# Patient Record
Sex: Female | Born: 1990
Health system: Southern US, Community
[De-identification: ages and names within clinical notes are randomized; demographics above are authoritative.]

## PROBLEM LIST (undated history)

## (undated) DIAGNOSIS — Z789 Other specified health status: Secondary | ICD-10-CM

## (undated) HISTORY — DX: Other specified health status: Z78.9

---
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
## 2011-07-28 LAB — OB RESULTS CONSOLE VARICELLA ZOSTER ANTIBODY, IGG: Varicella: IMMUNE

## 2011-07-28 LAB — SICKLE CELL SCREEN: Sickle Cell Screen: NEGATIVE

## 2011-07-28 LAB — OB RESULTS CONSOLE RUBELLA ANTIBODY, IGM: Rubella: IMMUNE

## 2011-08-18 ENCOUNTER — Encounter: Payer: Self-pay | Admitting: Obstetrics and Gynecology

## 2011-08-29 ENCOUNTER — Ambulatory Visit: Payer: Self-pay | Admitting: Obstetrics and Gynecology

## 2011-09-26 ENCOUNTER — Ambulatory Visit: Payer: Self-pay | Admitting: Obstetrics and Gynecology

## 2011-09-29 ENCOUNTER — Encounter: Payer: Self-pay | Admitting: Maternal & Fetal Medicine

## 2011-10-26 ENCOUNTER — Ambulatory Visit: Payer: Self-pay | Admitting: Obstetrics and Gynecology

## 2011-12-23 ENCOUNTER — Inpatient Hospital Stay: Payer: Self-pay

## 2011-12-23 LAB — CBC WITH DIFFERENTIAL/PLATELET
Basophil #: 0 10*3/uL (ref 0.0–0.1)
Eosinophil %: 0.2 %
HCT: 34.7 % — ABNORMAL LOW (ref 35.0–47.0)
HGB: 11.8 g/dL — ABNORMAL LOW (ref 12.0–16.0)
Lymphocyte #: 1.6 10*3/uL (ref 1.0–3.6)
MCH: 29.3 pg (ref 26.0–34.0)
MCV: 86 fL (ref 80–100)
Monocyte #: 0.4 x10 3/mm (ref 0.2–0.9)
Neutrophil %: 84.9 %
Platelet: 162 10*3/uL (ref 150–440)
RDW: 14.5 % (ref 11.5–14.5)

## 2011-12-23 LAB — HEMATOCRIT: HCT: 32.5 % — ABNORMAL LOW (ref 35.0–47.0)

## 2012-05-30 IMAGING — US US OB DETAIL+14 WK - NRPT [facility]
1 series · 14 of 28 positions shown · non-contrast
Comparison: none

[Series 1: us ob detail+14 wk - nrpt [facility] · 14 of 76 slices shown]
[im 3/76]
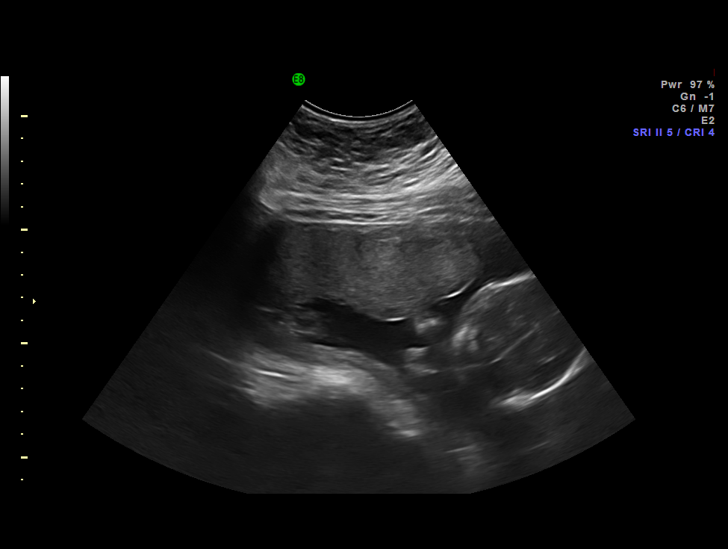
[im 9/76]
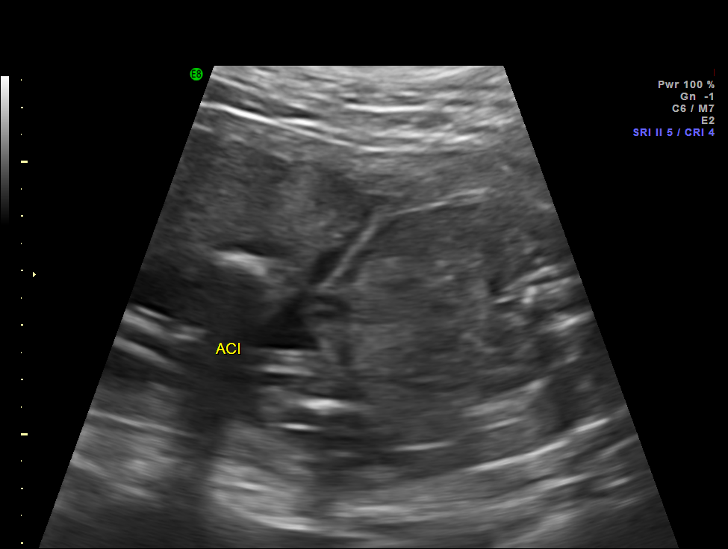
[im 14/76]
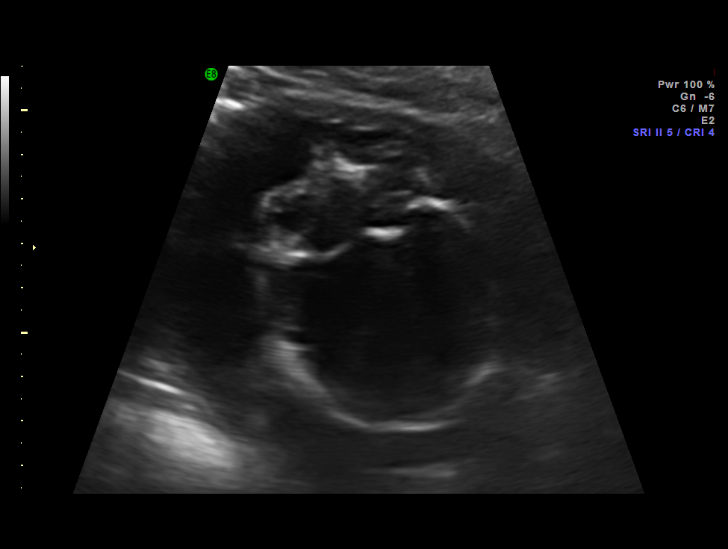
[im 20/76]
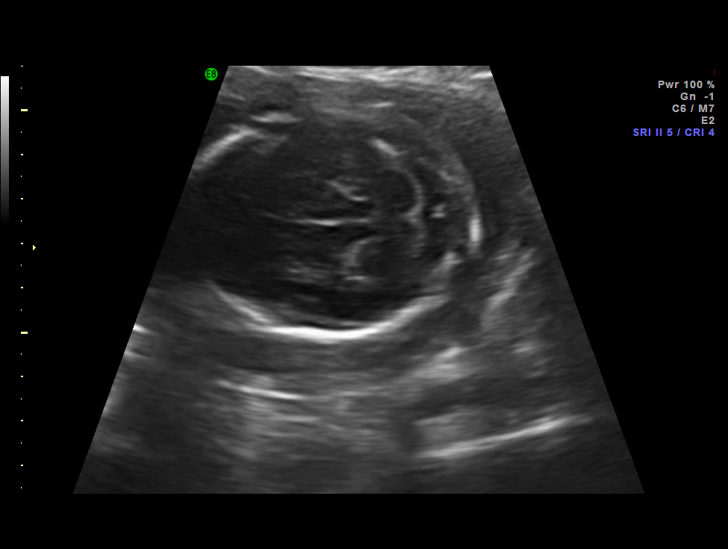
[im 26/76]
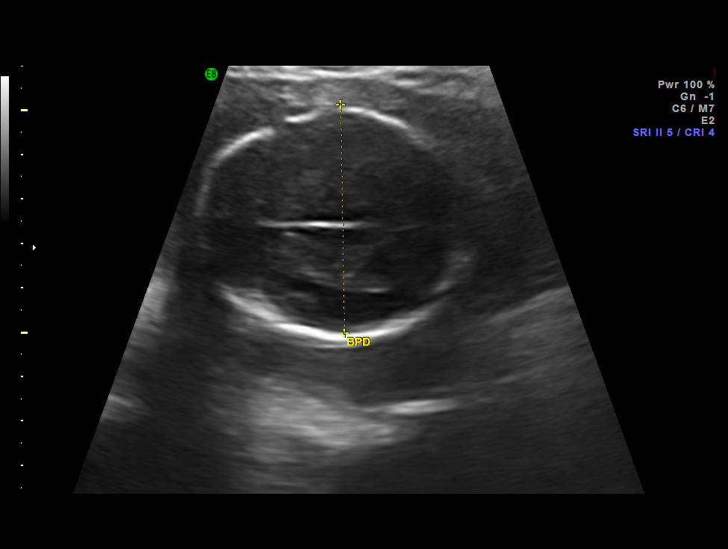
[im 31/76]
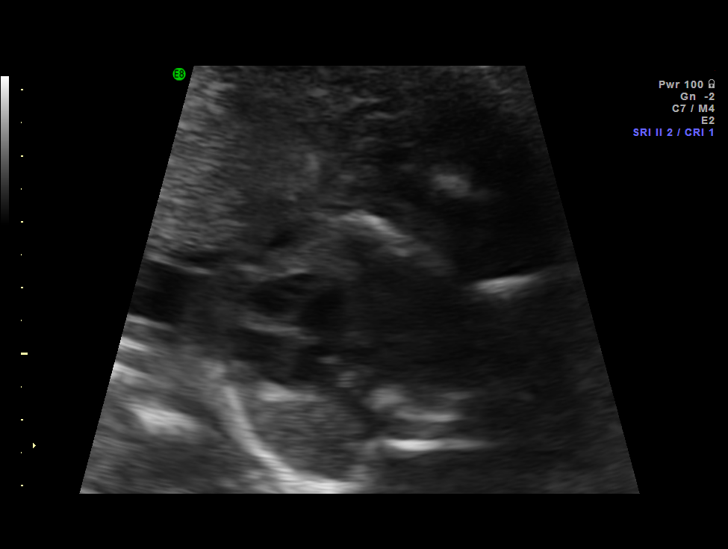
[im 37/76]
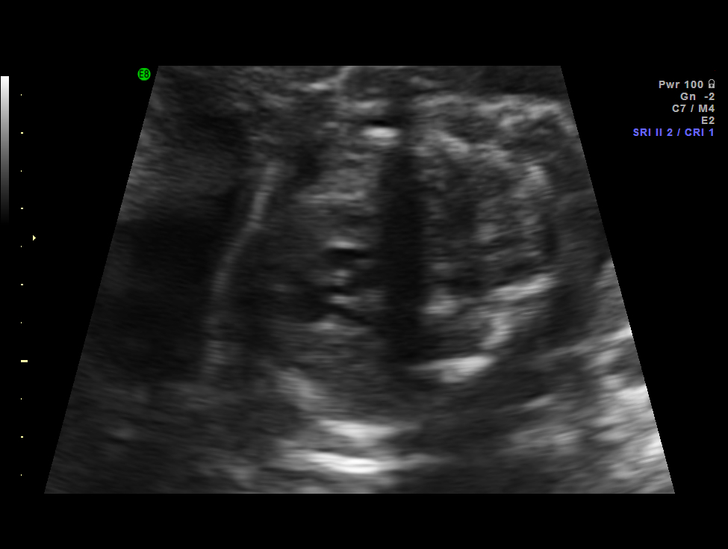
[im 42/76]
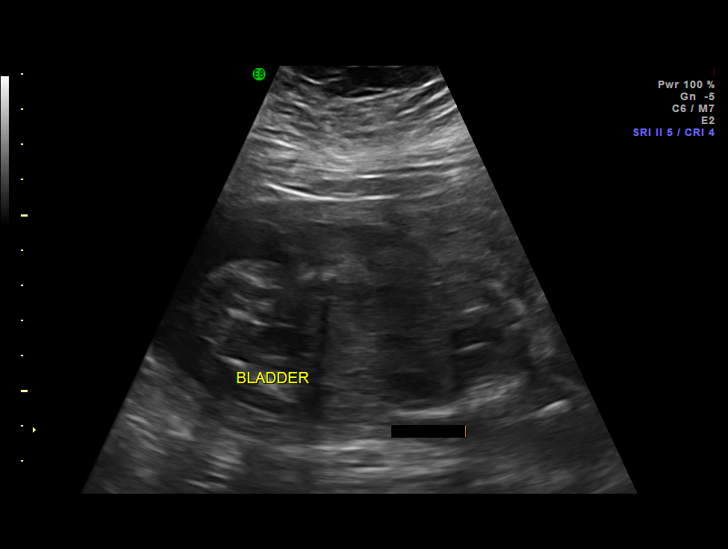
[im 48/76]
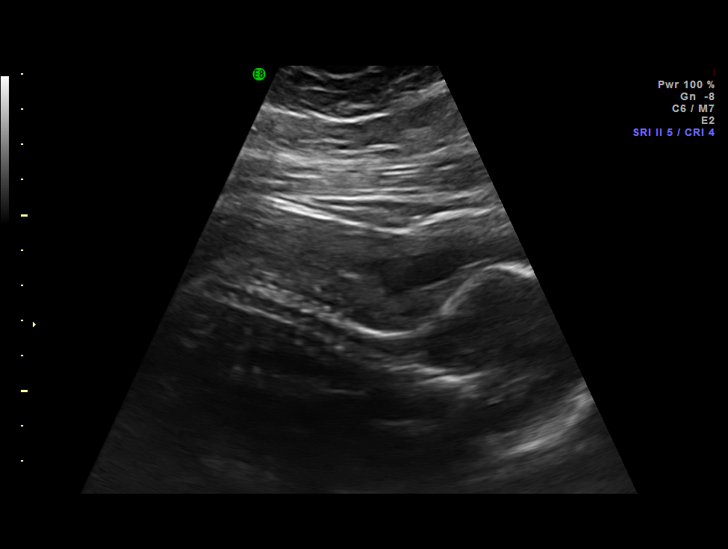
[im 53/76]
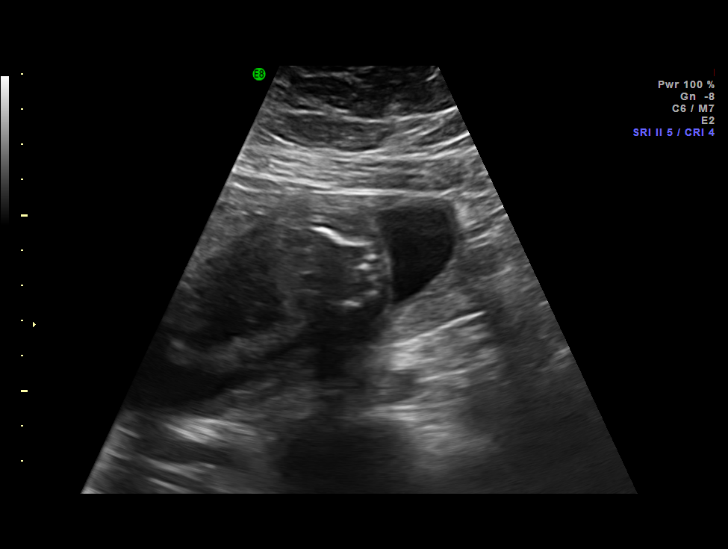
[im 59/76]
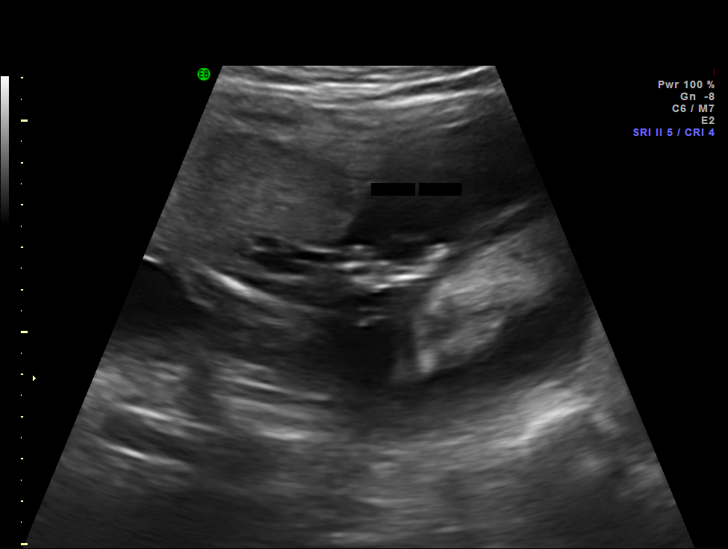
[im 64/76]
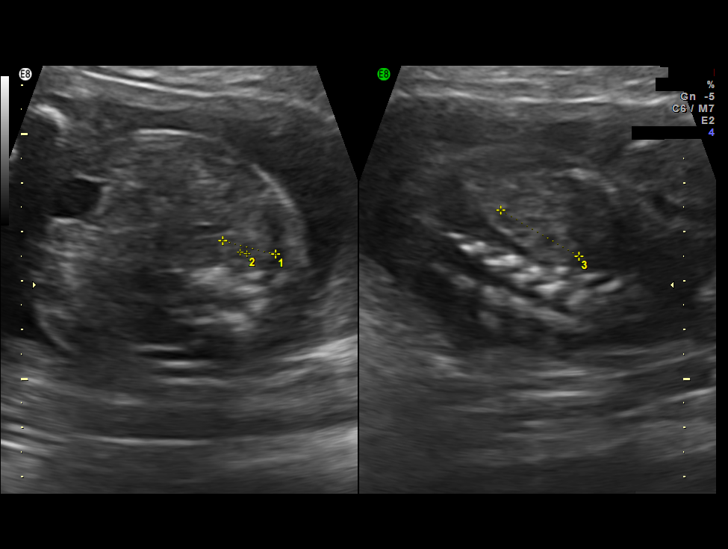
[im 70/76]
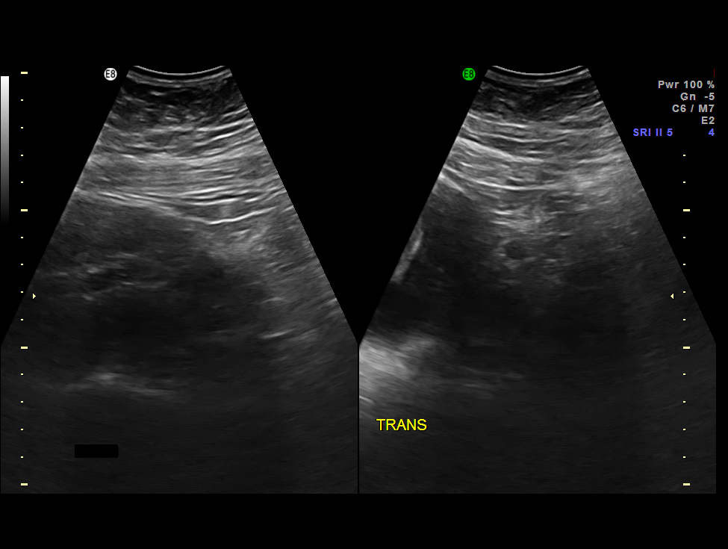
[im 76/76]
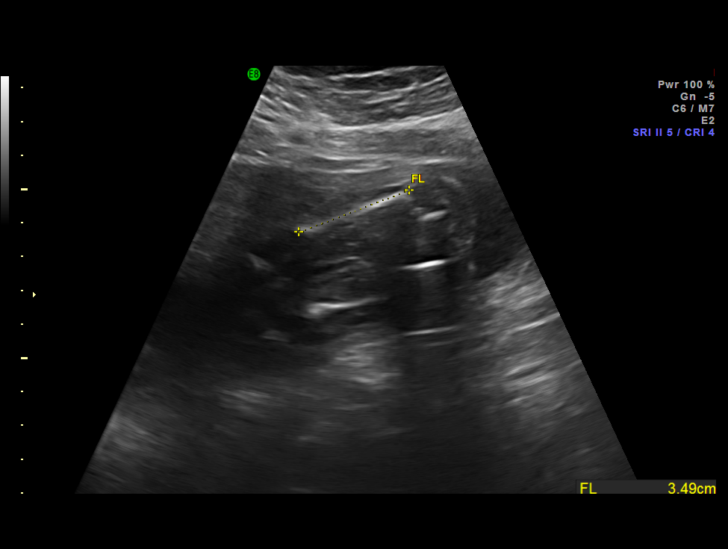

[14 of 28 positions shown; findings below may reference images not displayed]

IMAGES IMPORTED FROM THE [employer] WORKFLOW SYSTEM
NO DICTATION FOR STUDY

## 2012-07-11 IMAGING — US US OB FOLLOW-UP - [facility]PT [facility]
1 series · 14 of 28 positions shown · non-contrast
Comparison: none

[Series 1: us ob follow-up - nrpt [facility] · 14 of 67 slices shown]
[im 3/67]
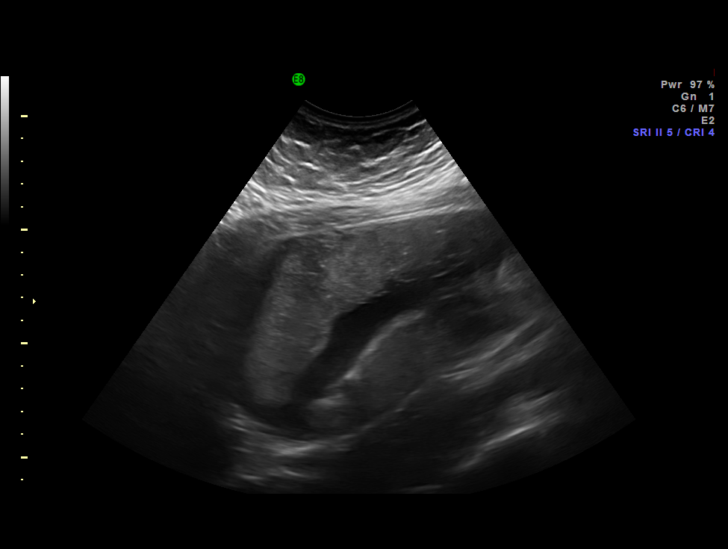
[im 8/67]
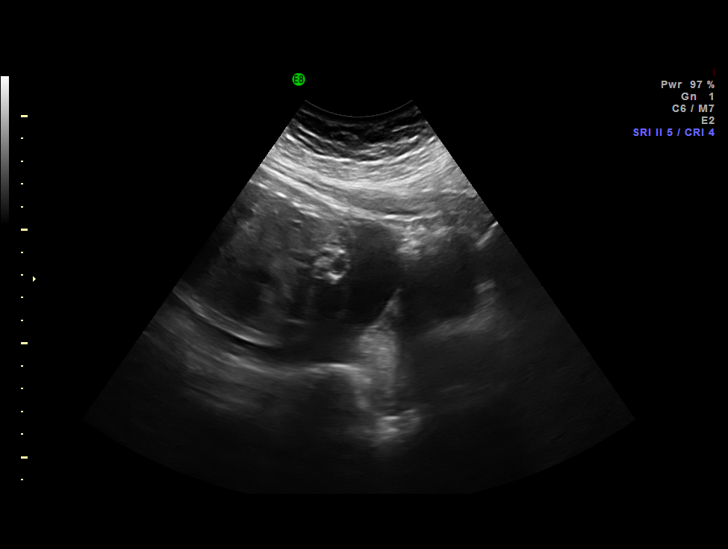
[im 13/67]
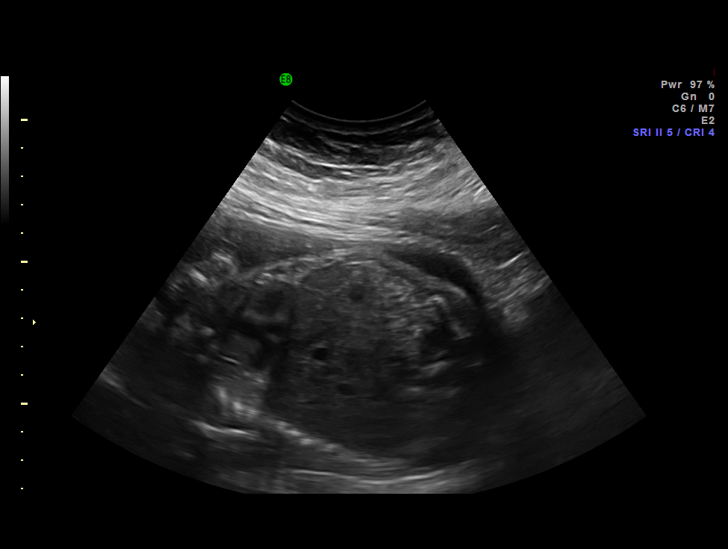
[im 18/67]
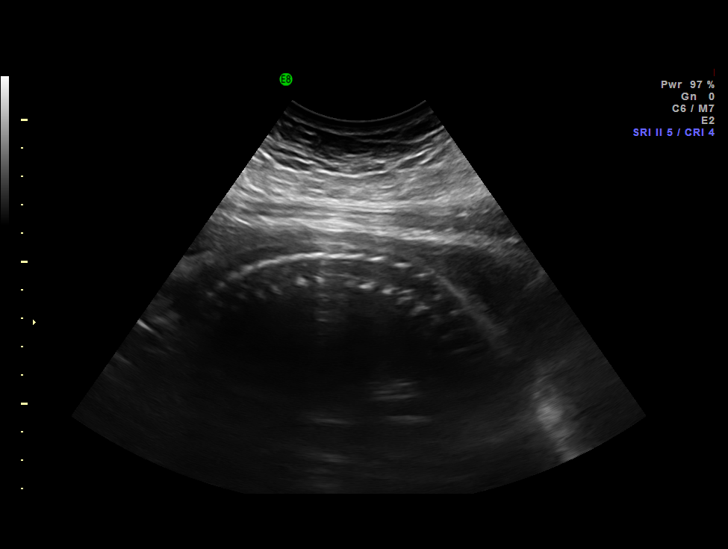
[im 23/67]
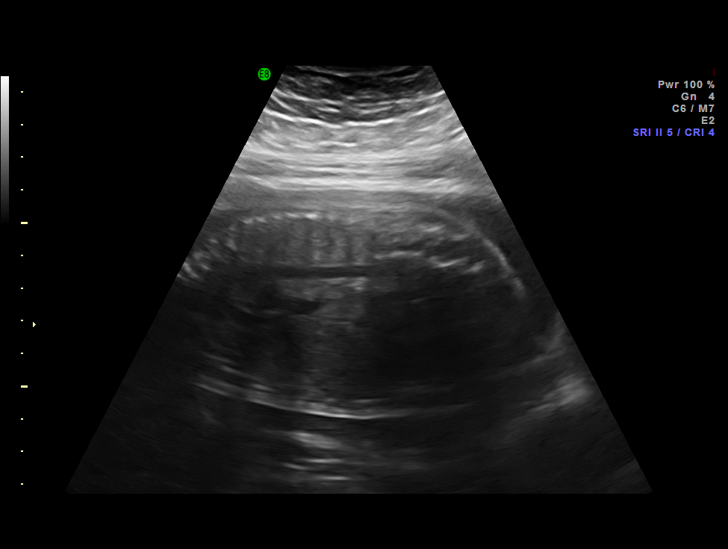
[im 27/67]
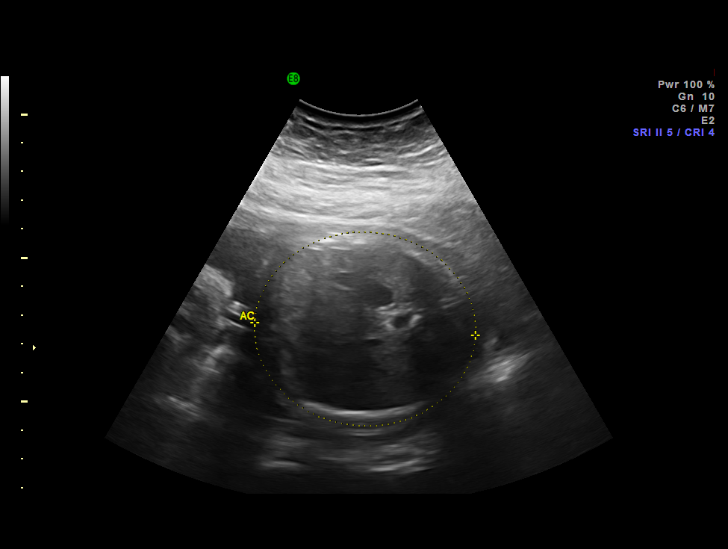
[im 32/67]
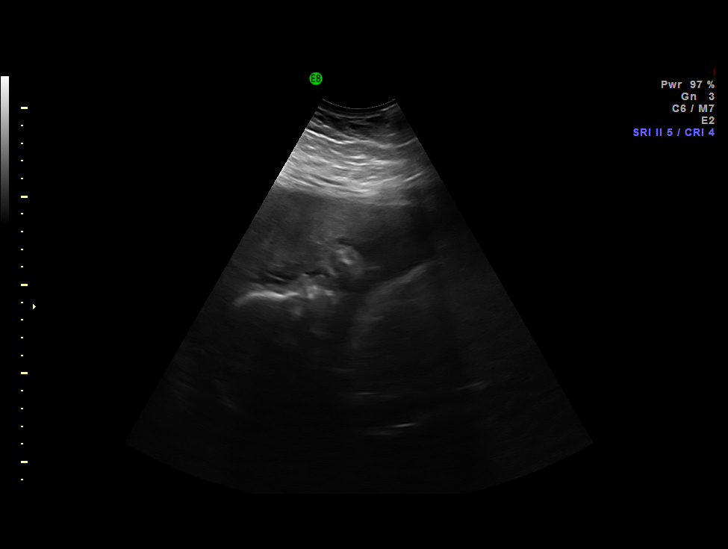
[im 37/67]
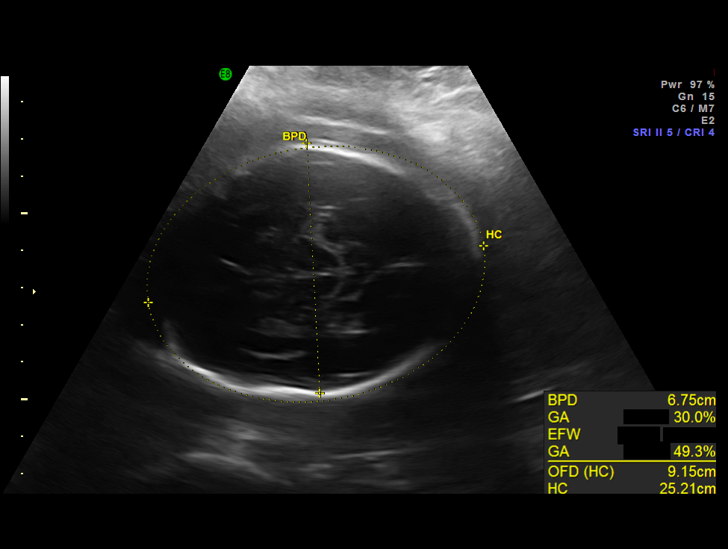
[im 42/67]
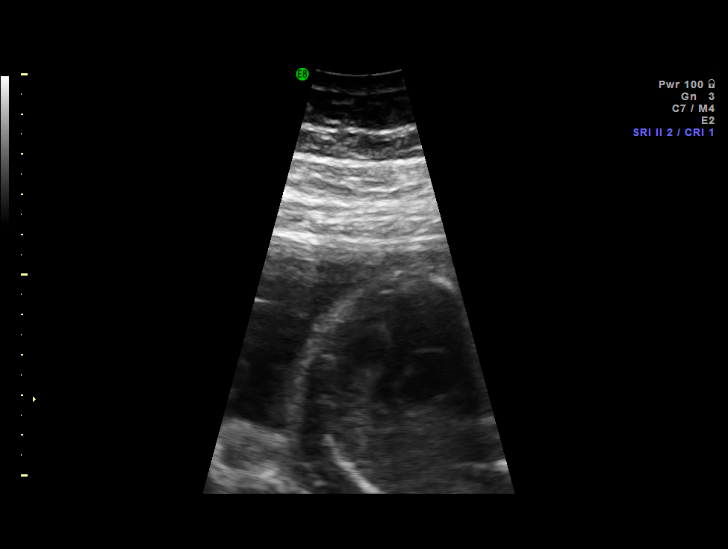
[im 47/67]
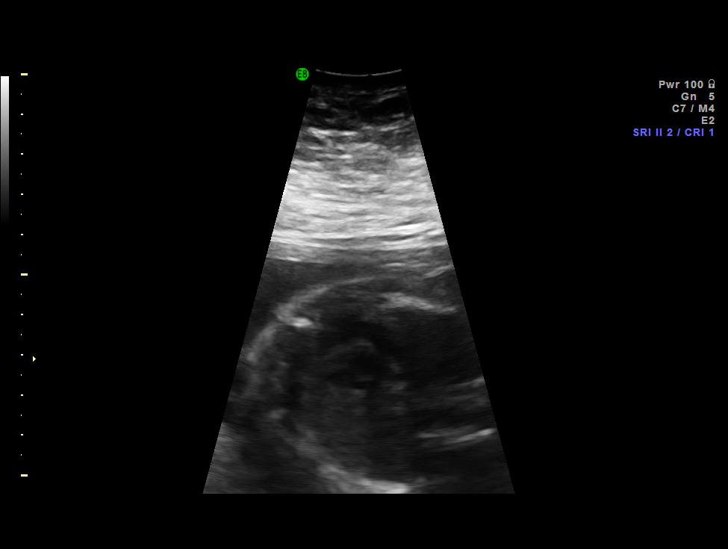
[im 52/67]
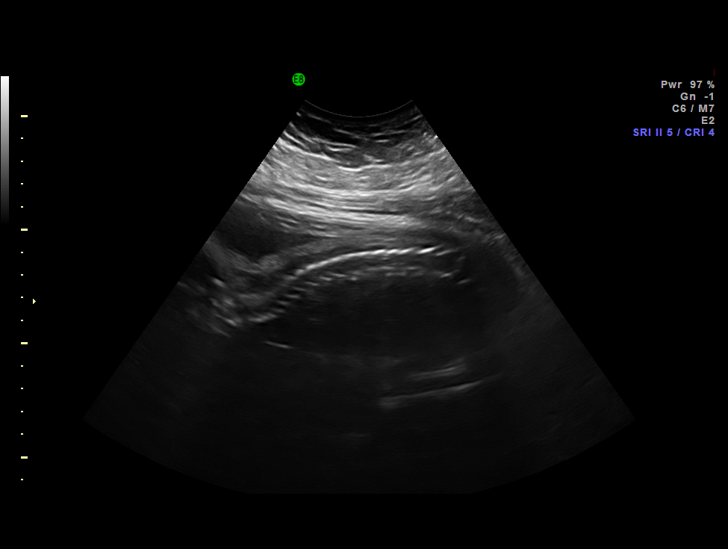
[im 57/67]
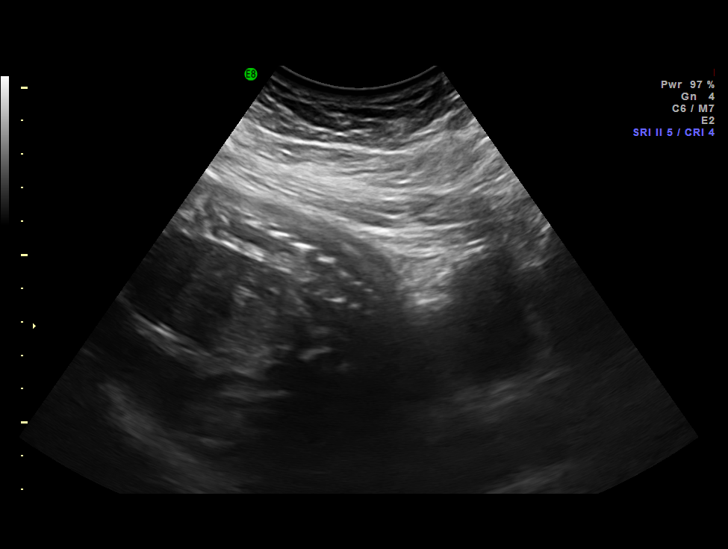
[im 62/67]
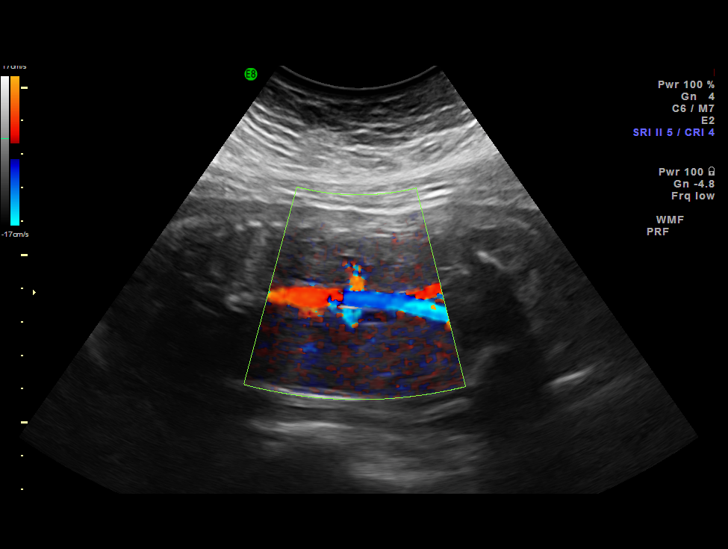
[im 67/67]
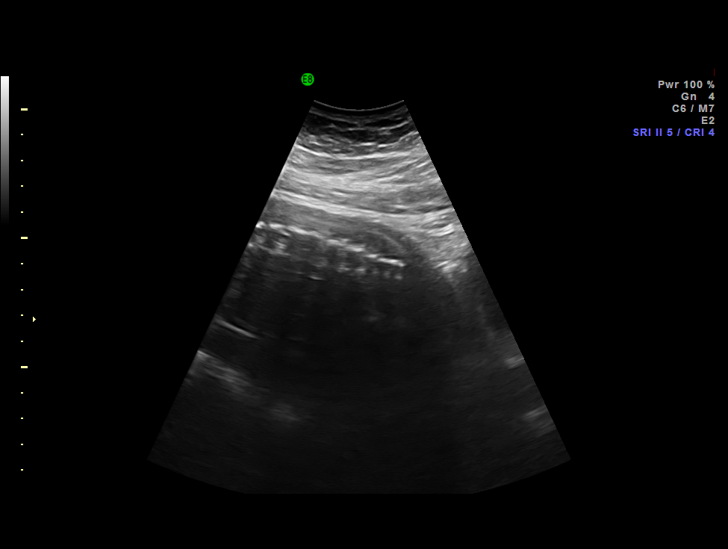

[14 of 28 positions shown; findings below may reference images not displayed]

IMAGES IMPORTED FROM THE [employer] WORKFLOW SYSTEM
NO DICTATION FOR STUDY

## 2014-10-19 NOTE — Consult Note (Signed)
Referral Information:   Reason for Referral 24 yo G1 p0 referred for elevated BMI -42    Referring Physician [facility]    Prenatal Hx Unsure LMP , some weight loss otherwise  uncomplicated thus far Jehovah's witness but will accept blood products in an emergency    Past Obstetrical Hx primigravida   Home Medications: Medication Instructions Status  Prenatal Multivitamins oral tablet 1 tab(s) orally once a day Active   Allergies:   No Known Allergies:   Vital Signs/Notes:  Nursing Vital Signs: **Vital Signs.:   21-Feb-13 11:26   Vital Signs Type Routine   Temperature Temperature (F) 98.2   Celsius 36.7   Temperature Source oral   Pulse Pulse 75   Pulse source per Dinamap   Respirations Respirations 14   Systolic BP Systolic BP 114   Diastolic BP (mmHg) Diastolic BP (mmHg) 58   Mean BP 76   BP Source Dinamap   Perinatal Consult:   LMP 15-Apr-2011    Past Medical History cont'd None - no asthma    PSurg Hx none    FHx obesity , father deceased in MVA    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 Her mom is obese also. 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 No co-morbidities reported , no DM,HTN or sleep apnea.    Recommendations 1 refer to lifestyle center- advised to limit weight gain to 15 pounds  2 I reviewed increased risk for HTN complicaitons including pregnancy. Given 20 weeks and normotensive , I did not  recommend baby aspirin.  3 No h/o snoring or excessive tiredness - I did not recommend a sleep study at this time 4 Follow up ultrasounds for growth in third trimester - I ordered one in 6 weeks - consider q 4 weeks if unable to follow fundal heights 5pt reports she had early glucola- repeat at 28 weeks if normal 6 Check TSH , baseline protein /creatinine in urine and consider renal chemistries given increased risk of preeclampsia  7 Sequential compression devices if cesarean birth.Use 3 g cefazolin does for prophylaxis if cesarean   Plan:   Genetic Counseling no    Prenatal Diagnosis Options Level II [place]    Ultrasound at what gestational ages Monthly > 28 weeks, if unable to follow fundal height    Antepartum Testing weekly at 36 weeks    Delivery Mode Vaginal    Additional Testing Thyroid panel, renal function baseline    Delivery at what gestational age by 5 weeks     Total Time Spent with Patient 15 minutes    >50% of visit spent in couseling/coordination of care yes    Office Use Only [address]  Level 1 ( ) NEW office consult prob focused   Coding Description: MATERNAL CONDITIONS/HISTORY INDICATION(S).   Obesity - BMI greater than equal to 30.  Electronic Signatures: Rondall Allegra (MD)  (Signed  21-Feb-13 13:33)  Authored: Referral, Home Medications, Allergies, Vital Signs/Notes, Consult, Exam, Impression, Plan, Billing, Coding Description   Last Updated: 21-Feb-13 13:33 by Rondall AllegraLivingston, Owais Pruett [place]am (MD)

## 2014-11-04 NOTE — H&P (Signed)
L&D Evaluation:  History:   HPI 24 year old Hispanic female with EDC=26 December 2011 based on a 21 week ultrasound was sent from [facility] with irregular contractions (q4-7 min apart) and with cervix dilated to 4 cm. Contractions started yesterday but worsened at 5 AM this morning. +mucoid discharge this AM, but no bleeding. She advanced in dilation to 6 cm in 1-2 hours. Prenatal care at [facility] remarkable for obesity with BMI>40. Labs: A POS, RI, VI, GBS negative    Presents with contractions, advanced cervical dilation in [facility]    Patient's Medical History Obesity-BMI>40    Medications Pre Natal Vitamins    Allergies NKDA    Social History none    Family History Non-Contributory   ROS:   ROS see HPI   Exam:   Vital Signs mildly elevated143/83, 150/80    Urine Protein negative dipstick    General no apparent distress, breaths/concentrates thru contractions    Mental Status clear    Chest clear    Heart normal sinus rhythm, no murmur/gallop/rubs    Abdomen gravid, tender with contractions    Estimated Fetal Weight Average for gestational age, 566 1/2 to 7 #    Fetal Position cephalic on US    Edema no edema    Reflexes 1+    Pelvic no external lesions, 6/80%/-1    Mebranes Intact    FHT normal rate with no decels, nicely reactive strip    FHT Description moderate variability    Ucx unable to pick up with external monitors    Skin dry    Lymph no lymphadenopathy   Impression:   Impression IUP at 39 4/7 weeks in labor   Plan:   Plan EFM/NST, monitor contractions and for cervical change, monitor BP, Plan AROM after admission completed    Comments 1705 PM: AROm scant clear-pink tinged fluid. IUPC inserted.   Electronic Signatures: Trinna BalloonGutierrez, Aleynah Rocchio L (CNM)  (Signed 28-Jun-13 17:06)  Authored: L&D Evaluation   Last Updated: 28-Jun-13 17:06 by Trinna BalloonGutierrez, Adoria Kawamoto L (CNM)

## 2015-10-15 ENCOUNTER — Encounter: Payer: Self-pay | Admitting: Podiatry

## 2015-10-15 ENCOUNTER — Ambulatory Visit (INDEPENDENT_AMBULATORY_CARE_PROVIDER_SITE_OTHER): Payer: BLUE CROSS/BLUE SHIELD

## 2015-10-15 ENCOUNTER — Ambulatory Visit (INDEPENDENT_AMBULATORY_CARE_PROVIDER_SITE_OTHER): Payer: BLUE CROSS/BLUE SHIELD | Admitting: Podiatry

## 2015-10-15 VITALS — BP 127/72 | HR 72 | Resp 18

## 2015-10-15 DIAGNOSIS — M722 Plantar fascial fibromatosis: Secondary | ICD-10-CM | POA: Insufficient documentation

## 2015-10-15 DIAGNOSIS — M7661 Achilles tendinitis, right leg: Secondary | ICD-10-CM | POA: Insufficient documentation

## 2015-10-15 DIAGNOSIS — R52 Pain, unspecified: Secondary | ICD-10-CM | POA: Diagnosis not present

## 2015-10-15 MED ORDER — MELOXICAM 15 MG PO TABS: 15.0000 mg | ORAL_TABLET | Freq: Every day | ORAL | Status: DC

## 2015-10-15 NOTE — Progress Notes (Signed)
   Subjective:    Patient ID: Tiffany Carpenter, female    DOB: 01/17/1991, 25 y.o.   MRN: [id number]  HPI  25 year old female presents the office today for concerns of left heel pain which is been ongoing for approximately 5 months. Senna time she's had some pain in the back of the right heel She states that she has pain in the morning and she first gets up works as a now presenting time and stands back up. Denies any redness or swelling. No tingling or numbness. The pain does not wake her up at night. She has no previous treatment. No other complaints.   Review of Systems  All other systems reviewed and are negative.      Objective:   Physical Exam General: AAO x3, NAD  Dermatological: Skin is warm, dry and supple bilateral. Nails x 10 are well manicured; remaining integument appears unremarkable at this time. There are no open sores, no preulcerative lesions, no rash or signs of infection present.  Vascular: Dorsalis Pedis artery and Posterior Tibial artery pedal pulses are 2/4 bilateral with immedate capillary fill time. Pedal hair growth present. No varicosities and no lower extremity edema present bilateral. There is no pain with calf compression, swelling, warmth, erythema.   Neruologic: Grossly intact via light touch bilateral. Vibratory intact via tuning fork bilateral. Protective threshold with Semmes Wienstein monofilament intact to all pedal sites bilateral. Patellar and Achilles deep tendon reflexes 2+ bilateral. No Babinski or clonus noted bilateral.   Musculoskeletal: On the right foot there is tenderness to palpation on the insertion the Achilles tendon. There is no pain with medial to lateral compression of the calcaneus. No defect noted the Achilles tendon tendon appears to be intact. Tenderness to palpation along the plantar medial tubercle of the calcaneus at the insertion of plantar fascia on the left foot. There is no pain along the course of the plantar fascia within the  arch of the foot. Plantar fascia appears to be intact. There is no pain with lateral compression of the calcaneus or pain with vibratory sensation. There is no pain along the course or insertion of the achilles tendon. No other areas of tenderness to bilateral lower extremitie  Gait: Unassisted, Nonantalgic.       Assessment & Plan:   25 year old female left heel pain, likely plantar fasciitis right Achilles tendinitis  -Treatment options discussed including all alternatives, risks, and complications -Etiology of symptoms were discussed -X-rays were obtained and reviewed with the patient. No evidence of acute fracture or stress fracture.  -Discussed steroid injection but she wishes to hold off  -Prescribed mobic. Discussed side effects of the medication and directed to stop if any are to occur and call the office.  -Plantar fascial brace  -Night splint  -Shoe gear changes  -Follow-up in 3 weeks or sooner if any problems arise. In the meantime, encouraged to call the office with any questions, concerns, change in symptoms.   Ovid CurdMatthew Wagoner, DPM

## 2015-10-15 NOTE — Patient Instructions (Signed)
Achilles Tendinitis With Rehab Achilles tendinitis is a disorder of the Achilles tendon. The Achilles tendon connects the large calf muscles (Gastrocnemius and Soleus) to the heel bone (calcaneus). This tendon is sometimes called the heel cord. It is important for pushing-off and standing on your toes and is important for walking, running, or jumping. Tendinitis is often caused by overuse and repetitive microtrauma. SYMPTOMS  Pain, tenderness, swelling, warmth, and redness may occur over the Achilles tendon even at rest.  Pain with pushing off, or flexing or extending the ankle.  Pain that is worsened after or during activity. CAUSES   Overuse sometimes seen with rapid increase in exercise programs or in sports requiring running and jumping.  Poor physical conditioning (strength and flexibility or endurance).  Running sports, especially training running down hills.  Inadequate warm-up before practice or play or failure to stretch before participation.  Injury to the tendon. PREVENTION   Warm up and stretch before practice or competition.  Allow time for adequate rest and recovery between practices and competition.  Keep up conditioning.  Keep up ankle and leg flexibility.  Improve or keep muscle strength and endurance.  Improve cardiovascular fitness.  Use proper technique.  Use proper equipment (shoes, skates).  To help prevent recurrence, taping, protective strapping, or an adhesive bandage may be recommended for several weeks after healing is complete. PROGNOSIS   Recovery may take weeks to several months to heal.  Longer recovery is expected if symptoms have been prolonged.  Recovery is usually quicker if the inflammation is due to a direct blow as compared with overuse or sudden strain. RELATED COMPLICATIONS   Healing time will be prolonged if the condition is not correctly treated. The injury must be given plenty of time to heal.  Symptoms can reoccur if  activity is resumed too soon.  Untreated, tendinitis may increase the risk of tendon rupture requiring additional time for recovery and possibly surgery. TREATMENT   The first treatment consists of rest anti-inflammatory medication, and ice to relieve the pain.  Stretching and strengthening exercises after resolution of pain will likely help reduce the risk of recurrence. Referral to a physical therapist or athletic trainer for further evaluation and treatment may be helpful.  A walking boot or cast may be recommended to rest the Achilles tendon. This can help break the cycle of inflammation and microtrauma.  Arch supports (orthotics) may be prescribed or recommended by your caregiver as an adjunct to therapy and rest.  Surgery to remove the inflamed tendon lining or degenerated tendon tissue is rarely necessary and has shown less than predictable results. MEDICATION   Nonsteroidal anti-inflammatory medications, such as aspirin and ibuprofen, may be used for pain and inflammation relief. Do not take within 7 days before surgery. Take these as directed by your caregiver. Contact your caregiver immediately if any bleeding, stomach upset, or signs of allergic reaction occur. Other minor pain relievers, such as acetaminophen, may also be used.  Pain relievers may be prescribed as necessary by your caregiver. Do not take prescription pain medication for longer than 4 to 7 days. Use only as directed and only as much as you need.  Cortisone injections are rarely indicated. Cortisone injections may weaken tendons and predispose to rupture. It is better to give the condition more time to heal than to use them. HEAT AND COLD  Cold is used to relieve pain and reduce inflammation for acute and chronic Achilles tendinitis. Cold should be applied for 10 to 15 minutes every   2 to 3 hours for inflammation and pain and immediately after any activity that aggravates your symptoms. Use ice packs or an ice  massage.  Heat may be used before performing stretching and strengthening activities prescribed by your caregiver. Use a heat pack or a warm soak. SEEK MEDICAL CARE IF:  Symptoms get worse or do not improve in 2 weeks despite treatment.  New, unexplained symptoms develop. Drugs used in treatment may produce side effects. EXERCISES RANGE OF MOTION (ROM) AND STRETCHING EXERCISES - Achilles Tendinitis  These exercises may help you when beginning to rehabilitate your injury. Your symptoms may resolve with or without further involvement from your physician, physical therapist or athletic trainer. While completing these exercises, remember:   Restoring tissue flexibility helps normal motion to return to the joints. This allows healthier, less painful movement and activity.  An effective stretch should be held for at least 30 seconds.  A stretch should never be painful. You should only feel a gentle lengthening or release in the stretched tissue. STRETCH - Gastroc, Standing   Place hands on wall.  Extend right / left leg, keeping the front knee somewhat bent.  Slightly point your toes inward on your back foot.  Keeping your right / left heel on the floor and your knee straight, shift your weight toward the wall, not allowing your back to arch.  You should feel a gentle stretch in the right / left calf. Hold this position for __________ seconds. Repeat __________ times. Complete this stretch __________ times per day. STRETCH - Soleus, Standing   Place hands on wall.  Extend right / left leg, keeping the other knee somewhat bent.  Slightly point your toes inward on your back foot.  Keep your right / left heel on the floor, bend your back knee, and slightly shift your weight over the back leg so that you feel a gentle stretch deep in your back calf.  Hold this position for __________ seconds. Repeat __________ times. Complete this stretch __________ times per day. STRETCH -  Gastrocsoleus, Standing  Note: This exercise can place a lot of stress on your foot and ankle. Please complete this exercise only if specifically instructed by your caregiver.   Place the ball of your right / left foot on a step, keeping your other foot firmly on the same step.  Hold on to the wall or a rail for balance.  Slowly lift your other foot, allowing your body weight to press your heel down over the edge of the step.  You should feel a stretch in your right / left calf.  Hold this position for __________ seconds.  Repeat this exercise with a slight bend in your knee. Repeat __________ times. Complete this stretch __________ times per day.  STRENGTHENING EXERCISES - Achilles Tendinitis These exercises may help you when beginning to rehabilitate your injury. They may resolve your symptoms with or without further involvement from your physician, physical therapist or athletic trainer. While completing these exercises, remember:   Muscles can gain both the endurance and the strength needed for everyday activities through controlled exercises.  Complete these exercises as instructed by your physician, physical therapist or athletic trainer. Progress the resistance and repetitions only as guided.  You may experience muscle soreness or fatigue, but the pain or discomfort you are trying to eliminate should never worsen during these exercises. If this pain does worsen, stop and make certain you are following the directions exactly. If the pain is still present after adjustments,   discontinue the exercise until you can discuss the trouble with your clinician. STRENGTH - Plantar-flexors   Sit with your right / left leg extended. Holding onto both ends of a rubber exercise band/tubing, loop it around the ball of your foot. Keep a slight tension in the band.  Slowly push your toes away from you, pointing them downward.  Hold this position for __________ seconds. Return slowly, controlling the  tension in the band/tubing. Repeat __________ times. Complete this exercise __________ times per day.  STRENGTH - Plantar-flexors   Stand with your feet shoulder width apart. Steady yourself with a wall or table using as little support as needed.  Keeping your weight evenly spread over the width of your feet, rise up on your toes.*  Hold this position for __________ seconds. Repeat __________ times. Complete this exercise __________ times per day.  *If this is too easy, shift your weight toward your right / left leg until you feel challenged. Ultimately, you may be asked to do this exercise with your right / left foot only. STRENGTH - Plantar-flexors, Eccentric  Note: This exercise can place a lot of stress on your foot and ankle. Please complete this exercise only if specifically instructed by your caregiver.   Place the balls of your feet on a step. With your hands, use only enough support from a wall or rail to keep your balance.  Keep your knees straight and rise up on your toes.  Slowly shift your weight entirely to your right / left toes and pick up your opposite foot. Gently and with controlled movement, lower your weight through your right / left foot so that your heel drops below the level of the step. You will feel a slight stretch in the back of your calf at the end position.  Use the healthy leg to help rise up onto the balls of both feet, then lower weight only on the right / left leg again. Build up to 15 repetitions. Then progress to 3 consecutive sets of 15 repetitions.*  After completing the above exercise, complete the same exercise with a slight knee bend (about 30 degrees). Again, build up to 15 repetitions. Then progress to 3 consecutive sets of 15 repetitions.* Perform this exercise __________ times per day.  *When you easily complete 3 sets of 15, your physician, physical therapist or athletic trainer may advise you to add resistance by wearing a backpack filled with  additional weight. STRENGTH - Plantar Flexors, Seated   Sit on a chair that allows your feet to rest flat on the ground. If necessary, sit at the edge of the chair.  Keeping your toes firmly on the ground, lift your right / left heel as far as you can without increasing any discomfort in your ankle. Repeat __________ times. Complete this exercise __________ times a day. *If instructed by your physician, physical therapist or athletic trainer, you may add ____________________ of resistance by placing a weighted object on your right / left knee.   This information is not intended to replace advice given to you by your health care provider. Make sure you discuss any questions you have with your health care provider.   Document Released: 01/12/2005 Document Revised: 07/04/2014 Document Reviewed: 09/25/2008 Else[employer] Interactive Patient Education 2016 [employer]   Plantar Fasciitis With Rehab The plantar fascia is a fibrous, ligament-like, soft-tissue structure that spans the bottom of the foot. Plantar fasciitis, also called heel spur syndrome, is a condition that causes pain in the foot due   to inflammation of the tissue. SYMPTOMS   Pain and tenderness on the underneath side of the foot.  Pain that worsens with standing or walking. CAUSES  Plantar fasciitis is caused by irritation and injury to the plantar fascia on the underneath side of the foot. Common mechanisms of injury include:  Direct trauma to bottom of the foot.  Damage to a small nerve that runs under the foot where the main fascia attaches to the heel bone.  Stress placed on the plantar fascia due to bone spurs. RISK INCREASES WITH:   Activities that place stress on the plantar fascia (running, jumping, pivoting, or cutting).  Poor strength and flexibility.  Improperly fitted shoes.  Tight calf muscles.  Flat feet.  Failure to warm-up properly before activity.  Obesity. PREVENTION  Warm up and stretch  properly before activity.  Allow for adequate recovery between workouts.  Maintain physical fitness:  Strength, flexibility, and endurance.  Cardiovascular fitness.  Maintain a health body weight.  Avoid stress on the plantar fascia.  Wear properly fitted shoes, including arch supports for individuals who have flat feet. PROGNOSIS  If treated properly, then the symptoms of plantar fasciitis usually resolve without surgery. However, occasionally surgery is necessary. RELATED COMPLICATIONS   Recurrent symptoms that may result in a chronic condition.  Problems of the lower back that are caused by compensating for the injury, such as limping.  Pain or weakness of the foot during push-off following surgery.  Chronic inflammation, scarring, and partial or complete fascia tear, occurring more often from repeated injections. TREATMENT  Treatment initially involves the use of ice and medication to help reduce pain and inflammation. The use of strengthening and stretching exercises may help reduce pain with activity, especially stretches of the Achilles tendon. These exercises may be performed at home or with a therapist. Your caregiver may recommend that you use heel cups of arch supports to help reduce stress on the plantar fascia. Occasionally, corticosteroid injections are given to reduce inflammation. If symptoms persist for greater than 6 months despite non-surgical (conservative), then surgery may be recommended.  MEDICATION   If pain medication is necessary, then nonsteroidal anti-inflammatory medications, such as aspirin and ibuprofen, or other minor pain relievers, such as acetaminophen, are often recommended.  Do not take pain medication within 7 days before surgery.  Prescription pain relievers may be given if deemed necessary by your caregiver. Use only as directed and only as much as you need.  Corticosteroid injections may be given by your caregiver. These injections should  be reserved for the most serious cases, because they may only be given a certain number of times. HEAT AND COLD  Cold treatment (icing) relieves pain and reduces inflammation. Cold treatment should be applied for 10 to 15 minutes every 2 to 3 hours for inflammation and pain and immediately after any activity that aggravates your symptoms. Use ice packs or massage the area with a piece of ice (ice massage).  Heat treatment may be used prior to performing the stretching and strengthening activities prescribed by your caregiver, physical therapist, or athletic trainer. Use a heat pack or soak the injury in warm water. SEEK IMMEDIATE MEDICAL CARE IF:  Treatment seems to offer no benefit, or the condition worsens.  Any medications produce adverse side effects. EXERCISES RANGE OF MOTION (ROM) AND STRETCHING EXERCISES - Plantar Fasciitis (Heel Spur Syndrome) These exercises may help you when beginning to rehabilitate your injury. Your symptoms may resolve with or without further involvement from your   physician, physical therapist or athletic trainer. While completing these exercises, remember:   Restoring tissue flexibility helps normal motion to return to the joints. This allows healthier, less painful movement and activity.  An effective stretch should be held for at least 30 seconds.  A stretch should never be painful. You should only feel a gentle lengthening or release in the stretched tissue. RANGE OF MOTION - Toe Extension, Flexion  Sit with your right / left leg crossed over your opposite knee.  Grasp your toes and gently pull them back toward the top of your foot. You should feel a stretch on the bottom of your toes and/or foot.  Hold this stretch for __________ seconds.  Now, gently pull your toes toward the bottom of your foot. You should feel a stretch on the top of your toes and or foot.  Hold this stretch for __________ seconds. Repeat __________ times. Complete this stretch  __________ times per day.  RANGE OF MOTION - Ankle Dorsiflexion, Active Assisted  Remove shoes and sit on a chair that is preferably not on a carpeted surface.  Place right / left foot under knee. Extend your opposite leg for support.  Keeping your heel down, slide your right / left foot back toward the chair until you feel a stretch at your ankle or calf. If you do not feel a stretch, slide your bottom forward to the edge of the chair, while still keeping your heel down.  Hold this stretch for __________ seconds. Repeat __________ times. Complete this stretch __________ times per day.  STRETCH - Gastroc, Standing  Place hands on wall.  Extend right / left leg, keeping the front knee somewhat bent.  Slightly point your toes inward on your back foot.  Keeping your right / left heel on the floor and your knee straight, shift your weight toward the wall, not allowing your back to arch.  You should feel a gentle stretch in the right / left calf. Hold this position for __________ seconds. Repeat __________ times. Complete this stretch __________ times per day. STRETCH - Soleus, Standing  Place hands on wall.  Extend right / left leg, keeping the other knee somewhat bent.  Slightly point your toes inward on your back foot.  Keep your right / left heel on the floor, bend your back knee, and slightly shift your weight over the back leg so that you feel a gentle stretch deep in your back calf.  Hold this position for __________ seconds. Repeat __________ times. Complete this stretch __________ times per day. STRETCH - Gastrocsoleus, Standing  Note: This exercise can place a lot of stress on your foot and ankle. Please complete this exercise only if specifically instructed by your caregiver.   Place the ball of your right / left foot on a step, keeping your other foot firmly on the same step.  Hold on to the wall or a rail for balance.  Slowly lift your other foot, allowing your body  weight to press your heel down over the edge of the step.  You should feel a stretch in your right / left calf.  Hold this position for __________ seconds.  Repeat this exercise with a slight bend in your right / left knee. Repeat __________ times. Complete this stretch __________ times per day.  STRENGTHENING EXERCISES - Plantar Fasciitis (Heel Spur Syndrome)  These exercises may help you when beginning to rehabilitate your injury. They may resolve your symptoms with or without further involvement from your physician,   physical therapist or athletic trainer. While completing these exercises, remember:   Muscles can gain both the endurance and the strength needed for everyday activities through controlled exercises.  Complete these exercises as instructed by your physician, physical therapist or athletic trainer. Progress the resistance and repetitions only as guided. STRENGTH - Towel Curls  Sit in a chair positioned on a non-carpeted surface.  Place your foot on a towel, keeping your heel on the floor.  Pull the towel toward your heel by only curling your toes. Keep your heel on the floor.  If instructed by your physician, physical therapist or athletic trainer, add ____________________ at the end of the towel. Repeat __________ times. Complete this exercise __________ times per day. STRENGTH - Ankle Inversion  Secure one end of a rubber exercise band/tubing to a fixed object (table, pole). Loop the other end around your foot just before your toes.  Place your fists between your knees. This will focus your strengthening at your ankle.  Slowly, pull your big toe up and in, making sure the band/tubing is positioned to resist the entire motion.  Hold this position for __________ seconds.  Have your muscles resist the band/tubing as it slowly pulls your foot back to the starting position. Repeat __________ times. Complete this exercises __________ times per day.    This information  is not intended to replace advice given to you by your health care provider. Make sure you discuss any questions you have with your health care provider.   Document Released: 06/13/2005 Document Revised: 10/28/2014 Document Reviewed: 09/25/2008 Else[employer] Interactive Patient Education 2016 [employer]  

## 2015-11-05 ENCOUNTER — Ambulatory Visit: Payer: BLUE CROSS/BLUE SHIELD | Admitting: Podiatry

## 2015-11-19 ENCOUNTER — Ambulatory Visit (INDEPENDENT_AMBULATORY_CARE_PROVIDER_SITE_OTHER): Payer: BLUE CROSS/BLUE SHIELD | Admitting: Podiatry

## 2015-11-19 ENCOUNTER — Ambulatory Visit: Payer: BLUE CROSS/BLUE SHIELD | Admitting: Podiatry

## 2015-11-19 ENCOUNTER — Encounter: Payer: Self-pay | Admitting: Podiatry

## 2015-11-19 DIAGNOSIS — L84 Corns and callosities: Secondary | ICD-10-CM

## 2015-11-19 DIAGNOSIS — M722 Plantar fascial fibromatosis: Secondary | ICD-10-CM

## 2015-11-19 MED ORDER — METHYLPREDNISOLONE 4 MG PO TBPK: ORAL_TABLET | ORAL | Status: DC

## 2015-11-26 NOTE — Progress Notes (Signed)
Patient ID: Tiffany Carpenter, female   DOB: 08/25/1990, 25 y.o.   MRN: [id number]  Subjective: [age] of female presents the office they for follow-up violation of left heel pain. She states that she has had improvement of the heel that she feels the pain is moving toward the outside aspect of her heel. She is continuing stretching, icing. Also continue with the plantar fascial brace and the night splint intermittently. He also has a corn to the inside aspect of her right fifth toe. No drainage or pus. No redness or swelling. Denies any systemic complaints such as fevers, chills, nausea, vomiting. No acute changes since last appointment, and no other complaints at this time.   Objective: AAO x3, NAD DP/PT pulses palpable bilaterally, CRT less than 3 seconds There is continued tenderness palpation of the plantar medial tubercle of the Pain to the insertion point of fashion on the right side however the pain is also somewhat more on the lateral aspect of the plantar heel. There is no pain with medial to lateral compression of the calcaneus. No pain on the Achilles tendon. Plantar fascia appears to be intact. No other areas of tenderness. Hyperkeratotic lesion on the medial aspect of the right fifth toe. There is adductovarus of the fifth digit. No areas of pinpoint bony tenderness or pain with vibratory sensation. MMT 5/5, ROM WNL. No edema, erythema, increase in warmth to bilateral lower extremities.  No open lesions or pre-ulcerative lesions.  No pain with calf compression, swelling, warmth, erythema  Assessment: Left heel pain, likely plantar fasciitis; right fifth digit corn  Plan: -All treatment options discussed with the patient including all alternatives, risks, complications.  -She wishes to continue to work on a steroid injection however she has decided to proceed with a Medrol Dosepak and discussed side effects. Once this is complete she can restart meloxicam but I would not take together and  she verbalized understanding of this. Continue stretching, icing as well as shoe modification thousand discuss orthotics. -Hyperkeratotic lesion was debrided. Discussed possible surgical intervention as well as conservative treatment including offloading. Pads were dispensed a properly. -Follow-up 4 weeks. -Patient encouraged to call the office with any questions, concerns, change in symptoms.   Ovid CurdMatthew Wagoner, DPM

## 2015-12-17 ENCOUNTER — Ambulatory Visit: Payer: BLUE CROSS/BLUE SHIELD | Admitting: Podiatry

## 2016-01-07 ENCOUNTER — Ambulatory Visit: Payer: BLUE CROSS/BLUE SHIELD | Admitting: Podiatry

## 2016-01-14 ENCOUNTER — Ambulatory Visit (INDEPENDENT_AMBULATORY_CARE_PROVIDER_SITE_OTHER): Payer: BLUE CROSS/BLUE SHIELD | Admitting: Podiatry

## 2016-01-14 ENCOUNTER — Encounter: Payer: Self-pay | Admitting: Podiatry

## 2016-01-14 DIAGNOSIS — L84 Corns and callosities: Secondary | ICD-10-CM

## 2016-01-14 DIAGNOSIS — M722 Plantar fascial fibromatosis: Secondary | ICD-10-CM

## 2016-01-14 NOTE — Progress Notes (Signed)
Patient ID: Tiffany Carpenter, female   DOB: 08/28/1990, 25 y.o.   MRN: [id number]  Subjective: 25 -year-old female presents the office they for follow-up violation of left heel pain she states that she so any pain to her left heel. She is making new stretching, icing when of plantar fascial brace. She says the pain is worsened when she gets up and she is having to wear Tom style shoes at work which hurt. She'll visit the calluses reform of the right fifth toe. Denies any systemic complaints such as fevers, chills, nausea, vomiting. No acute changes since last appointment, and no other complaints at this time.   Objective: AAO x3, NAD; presents today wearing a flat strap shoe DP/PT pulses palpable bilaterally, CRT less than 3 seconds There is continued tenderness palpation of the plantar medial tubercle of the calcaneus insertional plantar fasciitis, left foot. Visual field course of plantar fashion the arch of the foot. Plantar fascia appears to be intact. No counts on the course or insertion of the Achilles tendon. No overlying edema, erythema. Hyperkeratotic lesion on the medial and lateral aspect of the right fifth toe. There is adductovarus of the fifth digit. No areas of pinpoint bony tenderness or pain with vibratory sensation. MMT 5/5, ROM WNL. No edema, erythema, increase in warmth to bilateral lower extremities.  No open lesions or pre-ulcerative lesions.  No pain with calf compression, swelling, warmth, erythema  Assessment: Left heel pain, likely plantar fasciitis; right fifth digihyperkeratotic lesion   Plan: -All treatment options discussed with the patient including all alternatives, risks, complications.  -Patient elects to proceed with steroid injection into the left heel. Under sterile skin preparation, a total of 2.5cc of kenalog 10, 0.5% Marcaine plain, and 2% lidocaine plain were infiltrated into the symptomatic area without complication. A band-aid was applied. Patient  tolerated the injection well without complication. Post-injection care with discussed with the patient. Discussed with the patient to ice the area over the next couple of days to help prevent a steroid flare. Continue stretching, icing. I discussed with her shoe gear modifications and orthotics. -Hyperkeratotic lesion debrided without, occasions or bleeding to the right fifth toe. -Follow-up 4 weeks. -Patient encouraged to call the office with any questions, concerns, change in symptoms.   Ovid CurdMatthew Casimer Russett, DPM

## 2016-02-11 ENCOUNTER — Ambulatory Visit (INDEPENDENT_AMBULATORY_CARE_PROVIDER_SITE_OTHER): Payer: BLUE CROSS/BLUE SHIELD | Admitting: Podiatry

## 2016-02-11 ENCOUNTER — Encounter: Payer: Self-pay | Admitting: Podiatry

## 2016-02-11 DIAGNOSIS — M7661 Achilles tendinitis, right leg: Secondary | ICD-10-CM

## 2016-02-11 DIAGNOSIS — M722 Plantar fascial fibromatosis: Secondary | ICD-10-CM | POA: Diagnosis not present

## 2016-02-19 NOTE — Progress Notes (Signed)
Subjective: 25 year old female presents the office today for follow up evaluation of left heel pain. She states that overall the pain is about the same compared to what it was. She did not get much relief after the injection. She has been stretching icing but not consistently. No numbness or tingling to her foot. The pain does not wake her up. The majority of pain has been walking with the mornings and she gets up. Denies any systemic complaints such as fevers, chills, nausea, vomiting. No acute changes since last appointment, and no other complaints at this time.   Objective: AAO x3, NAD DP/PT pulses palpable bilaterally, CRT less than 3 seconds There is continued tenderness palpation upon plantar medial tubercle the pain to the insertion the plantar fascia on the left foot. There is no pain on the course of plantar fascial within the arch of the foot. Fascia appears to be intact. No pain on the Achilles tendon. Thompson test is negative. Negative Tinel sign. No other areas of tenderness bilaterally. No lesions or pre-ulcerative lesions. There is no pain with calf compression, swelling, warmth, erythema.  Assessment: 25 year old female continued left heel pain, plantar fasciitis  Plan: -Treatment options discussed including all alternatives, risks, and complications -Etiology of symptoms were discussed -At this time a discussed with her custom orthotics. She was scanned for [facility]. I readily continue with stretching, icing exercises on a consistent basis. Follow-up in 4 weeks once orthotics arrive or sooner if any issues are to arise. Call any questions or concerns in the meantime.  Ovid CurdMatthew Wagoner, DPM

## 2016-03-10 ENCOUNTER — Encounter: Payer: Self-pay | Admitting: Podiatry

## 2016-03-10 ENCOUNTER — Ambulatory Visit (INDEPENDENT_AMBULATORY_CARE_PROVIDER_SITE_OTHER): Payer: BLUE CROSS/BLUE SHIELD | Admitting: Podiatry

## 2016-03-10 DIAGNOSIS — M7661 Achilles tendinitis, right leg: Secondary | ICD-10-CM

## 2016-03-10 DIAGNOSIS — M722 Plantar fascial fibromatosis: Secondary | ICD-10-CM

## 2016-03-10 NOTE — Progress Notes (Signed)
Patient presents a pickup orthotics. She was seen today by Hadley PenLisa Cox, CMA to dispense orthotics. Oral and written break in instructions discussed. She declined appointment by myself. Follow-up in 4 weeks.

## 2016-03-10 NOTE — Patient Instructions (Signed)

## 2016-04-07 ENCOUNTER — Ambulatory Visit: Payer: BLUE CROSS/BLUE SHIELD | Admitting: Podiatry

## 2017-10-06 DIAGNOSIS — S80269A Insect bite (nonvenomous), unspecified knee, initial encounter: Secondary | ICD-10-CM | POA: Diagnosis not present

## 2018-03-01 DIAGNOSIS — Z01419 Encounter for gynecological examination (general) (routine) without abnormal findings: Secondary | ICD-10-CM | POA: Diagnosis not present

## 2018-03-01 DIAGNOSIS — Z3161 Procreative counseling and advice using natural family planning: Secondary | ICD-10-CM | POA: Diagnosis not present

## 2018-03-01 DIAGNOSIS — Z3049 Encounter for surveillance of other contraceptives: Secondary | ICD-10-CM | POA: Diagnosis not present

## 2018-03-01 DIAGNOSIS — Z3009 Encounter for other general counseling and advice on contraception: Secondary | ICD-10-CM | POA: Diagnosis not present

## 2018-03-01 LAB — HM PAP SMEAR: HM Pap smear: NEGATIVE

## 2018-03-15 ENCOUNTER — Ambulatory Visit: Payer: Self-pay | Admitting: Family Medicine

## 2018-04-12 ENCOUNTER — Ambulatory Visit: Payer: BLUE CROSS/BLUE SHIELD | Admitting: Family Medicine

## 2018-04-12 ENCOUNTER — Encounter: Payer: Self-pay | Admitting: Family Medicine

## 2018-04-12 VITALS — BP 118/76 | HR 84 | Temp 99.0°F | Resp 16 | Ht 66.0 in | Wt 271.3 lb

## 2018-04-12 DIAGNOSIS — Z6841 Body Mass Index (BMI) 40.0 and over, adult: Secondary | ICD-10-CM

## 2018-04-12 DIAGNOSIS — Z23 Encounter for immunization: Secondary | ICD-10-CM | POA: Diagnosis not present

## 2018-04-12 DIAGNOSIS — E66813 Obesity, class 3: Secondary | ICD-10-CM | POA: Insufficient documentation

## 2018-04-12 DIAGNOSIS — R42 Dizziness and giddiness: Secondary | ICD-10-CM | POA: Diagnosis not present

## 2018-04-12 DIAGNOSIS — Z3169 Encounter for other general counseling and advice on procreation: Secondary | ICD-10-CM | POA: Diagnosis not present

## 2018-04-12 DIAGNOSIS — Z114 Encounter for screening for human immunodeficiency virus [HIV]: Secondary | ICD-10-CM

## 2018-04-12 DIAGNOSIS — Z862 Personal history of diseases of the blood and blood-forming organs and certain disorders involving the immune mechanism: Secondary | ICD-10-CM | POA: Insufficient documentation

## 2018-04-12 DIAGNOSIS — Z7689 Persons encountering health services in other specified circumstances: Secondary | ICD-10-CM

## 2018-04-12 DIAGNOSIS — Z13220 Encounter for screening for lipoid disorders: Secondary | ICD-10-CM

## 2018-04-12 NOTE — Progress Notes (Signed)
Name: Tiffany Carpenter   MRN: [id number]    DOB: [birth year]   Date:04/12/2018       Progress Note  Subjective  Chief Complaint  Chief Complaint  Patient presents with  . Establish Care  . Dizziness    HPI  Pt presents to establish care and for the following:  Dizziness: She had dizzness for about a week in September, this was after taking out the Nexplanon (had it in total 6 years) and was dieting at the time.  She stopped her dieting (was using herbal life shakes) and the dizziness went away.  She has not had any symptoms since then  Obesity: She is trying to eat healthier and exercises infrequently (mainly walks or hiking), does Zumba on Mondays.  She is currently 271lbs.  She would like to be 180lbs ideally. Has seen a nutritionist in the past, declines today.  Preconception Counseling: She had Nexplanon removed in September, has been trying to become pregnant since then. She does have a 31 year old son as well. She does endorse some hirsutism.  She is not taking prenatal daily - encouraged her to do so.  We discussed possibility of PCOS - advised cannot confirm without [place] - we will check fasting insulin today.  Advised may start metformin if she would like - declines today.  Patient Active Problem List   Diagnosis Date Noted  . Plantar fasciitis 10/15/2015  . Achilles tendinitis of right lower extremity 10/15/2015    History reviewed. No pertinent surgical history.  History reviewed. No pertinent family history.  Social History   Socioeconomic History  . Marital status: Married    Spouse name: Tiffany Carpenter  . Number of children: 1  . Years of education: Not on file  . Highest education level: Not on file  Occupational History  . Not on file  Social Needs  . Financial resource strain: Not hard at all  . Food insecurity:    Worry: Never true    Inability: Never true  . Transportation needs:    Medical: No    Non-medical: No  Tobacco Use  . Smoking status: Never  Smoker  . Smokeless tobacco: Never Used  Substance and Sexual Activity  . Alcohol use: No    Alcohol/week: 0.0 standard drinks  . Drug use: No  . Sexual activity: Yes    Partners: Male  Lifestyle  . Physical activity:    Days per week: 0 days    Minutes per session: 0 min  . Stress: Not at all  Relationships  . Social connections:    Talks on phone: More than three times a week    Gets together: More than three times a week    Attends religious service: Never    Active member of club or organization: No    Attends meetings of clubs or organizations: Never    Relationship status: Married  . Intimate partner violence:    Fear of current or ex partner: No    Emotionally abused: No    Physically abused: No    Forced sexual activity: No  Other Topics Concern  . Not on file  Social History Narrative  . Not on file    Current Outpatient Medications:  .  Prenatal Vit-Fe Fumarate-FA (PRENATAL VITAMIN PO), Take 1 tablet by mouth daily., Disp: , Rfl:   No Known Allergies  I personally reviewed active problem list, medication list, allergies, family history, social history, health maintenance with the patient/caregiver today.   ROS  Constitutional: Negative for fever or weight change.  Respiratory: Negative for cough and shortness of breath.   Cardiovascular: Negative for chest pain or palpitations.  Gastrointestinal: Negative for abdominal pain, no bowel changes.  Musculoskeletal: Negative for gait problem or joint swelling.  Skin: Negative for rash.  Neurological: Negative for dizziness or headache.  No other specific complaints in a complete review of systems (except as listed in HPI above).  Objective  Vitals:   04/12/18 1010  BP: 118/76  Pulse: 84  Resp: 16  Temp: 99 F (37.2 C)  TempSrc: Oral  SpO2: 99%  Weight: 271 lb 4.8 oz (123.1 kg)  Height: 5\' 6"  (1.676 m)   Body mass index is 43.79 kg/m.  Physical Exam Constitutional: Patient appears well-developed  and Obese. No distress.  HENT: Head: Normocephalic and atraumatic. Ears: bilateral TMs with no erythema or effusion; Nose: Nose normal. Mouth/Throat: Oropharynx is clear and moist. No oropharyngeal exudate or tonsillar swelling.  Eyes: Conjunctivae and EOM are normal. No scleral icterus.  Pupils are equal, round, and reactive to light.  Neck: Normal range of motion. Neck supple. No JVD present. No thyromegaly present.  Cardiovascular: Normal rate, regular rhythm and normal heart sounds.  No murmur heard. No BLE edema. Pulmonary/Chest: Effort normal and breath sounds normal. No respiratory distress. Abdominal: Soft. Bowel sounds are normal, no distension. There is no tenderness. No masses. Musculoskeletal: Normal range of motion, no joint effusions. No gross deformities Neurological: Pt is alert and oriented to person, place, and time. No cranial nerve deficit. Coordination, balance, strength, speech and gait are normal.  Skin: Skin is warm and dry. No rash noted. No erythema.  Psychiatric: Patient has a normal mood and affect. behavior is normal. Judgment and thought content normal.  No results found for this or any previous visit (from the past 72 hour(s)).   PHQ2/9: Depression screen PHQ 2/9 04/12/2018  Decreased Interest 0  Down, Depressed, Hopeless 0  PHQ - 2 Score 0  Altered sleeping 0  Tired, decreased energy 0  Change in appetite 0  Feeling bad or failure about yourself  0  Trouble concentrating 0  Moving slowly or fidgety/restless 0  Suicidal thoughts 0  PHQ-9 Score 0  Difficult doing work/chores Not difficult at all   Fall Risk: Fall Risk  04/12/2018  Falls in the past year? No    Assessment & Plan  1. Class 3 severe obesity due to excess calories without serious comorbidity with body mass index (BMI) of 40.0 to 44.9 in adult (HCC) - TSH - Insulin, random - COMPLETE METABOLIC PANEL WITH GFR - Lipid panel - CBC w/Diff/Platelet - Discussed importance of 150 minutes  of physical activity weekly, eat two servings of fish weekly, eat one serving of tree nuts ( cashews, pistachios, pecans, almonds.Marland Kitchen) every other day, eat 6 servings of fruit/vegetables daily and drink plenty of water and avoid sweet beverages.   2. Lipid screening - COMPLETE METABOLIC PANEL WITH GFR  3. Encounter for preconception consultation - TSH - Insulin, random - COMPLETE METABOLIC PANEL WITH GFR - Lipid panel - CBC w/Diff/Platelet -Discussed preconception care in detail with patient including starting prenatal vitamin with folic acid, regular exercise, healthy diet, and striving towards a healthy weight through gradual and healthy weight loss.  We will check the above labs, we did discuss the possibility of PCOS.  I did advise that it can take quite some time after removing Nexplanon for regular periods and for fertility to return.  I recommend  that she continue her routine GYN follow-ups with the health department at this time, and call me for referral to fertility/OB if unable to become pregnant in 12 months.  4. Dizziness - TSH - COMPLETE METABOLIC PANEL WITH GFR - CBC w/Diff/Platelet  5. History of anemia - CBC w/Diff/Platelet  6. Encounter to establish care  7. Needs flu shot - Flu Vaccine QUAD 6+ mos PF IM (Fluarix Quad PF)  8. Encounter for screening for HIV - HIV Antibody (routine testing w rflx)  Face-to-face time with patient was more than 25 minutes, >50% time spent counseling and coordination of care

## 2018-04-13 LAB — COMPLETE METABOLIC PANEL WITH GFR
AG Ratio: 1.6 (calc) (ref 1.0–2.5)
ALT: 34 U/L — AB (ref 6–29)
AST: 22 U/L (ref 10–30)
Albumin: 4.1 g/dL (ref 3.6–5.1)
Alkaline phosphatase (APISO): 79 U/L (ref 33–115)
BUN: 12 mg/dL (ref 7–25)
CALCIUM: 8.6 mg/dL (ref 8.6–10.2)
CO2: 26 mmol/L (ref 20–32)
CREATININE: 0.67 mg/dL (ref 0.50–1.10)
Chloride: 104 mmol/L (ref 98–110)
GFR, EST NON AFRICAN AMERICAN: 122 mL/min/{1.73_m2} (ref >=60)
GFR, Est African American: 142 mL/min/{1.73_m2} (ref >=60)
GLUCOSE: 88 mg/dL (ref 65–139)
Globulin: 2.6 g/dL (calc) (ref 1.9–3.7)
Potassium: 4.1 mmol/L (ref 3.5–5.3)
Sodium: 137 mmol/L (ref 135–146)
Total Bilirubin: 0.5 mg/dL (ref 0.2–1.2)
Total Protein: 6.7 g/dL (ref 6.1–8.1)

## 2018-04-13 LAB — CBC WITH DIFFERENTIAL/PLATELET
BASOS ABS: 30 {cells}/uL (ref 0–200)
Basophils Relative: 0.3 %
EOS ABS: 400 {cells}/uL (ref 15–500)
Eosinophils Relative: 4 %
HEMATOCRIT: 37 % (ref 35.0–45.0)
Hemoglobin: 12.5 g/dL (ref 11.7–15.5)
Lymphs Abs: 2260 cells/uL (ref 850–3900)
MCH: 28.1 pg (ref 27.0–33.0)
MCHC: 33.8 g/dL (ref 32.0–36.0)
MCV: 83.1 fL (ref 80.0–100.0)
MONOS PCT: 3.3 %
MPV: 11.8 fL (ref 7.5–12.5)
NEUTROS PCT: 69.8 %
Neutro Abs: 6980 cells/uL (ref 1500–7800)
Platelets: 235 10*3/uL (ref 140–400)
RBC: 4.45 10*6/uL (ref 3.80–5.10)
RDW: 13.1 % (ref 11.0–15.0)
Total Lymphocyte: 22.6 %
WBC mixed population: 330 cells/uL (ref 200–950)
WBC: 10 10*3/uL (ref 3.8–10.8)

## 2018-04-13 LAB — LIPID PANEL
CHOL/HDL RATIO: 3.3 (calc) (ref <5.0)
Cholesterol: 134 mg/dL (ref <200)
HDL: 41 mg/dL — AB (ref >50)
LDL CHOLESTEROL (CALC): 73 mg/dL
Non-HDL Cholesterol (Calc): 93 mg/dL (calc) (ref <130)
TRIGLYCERIDES: 123 mg/dL (ref <150)

## 2018-04-13 LAB — INSULIN, RANDOM: Insulin: 11.1 u[IU]/mL (ref 2.0–19.6)

## 2018-04-13 LAB — HIV ANTIBODY (ROUTINE TESTING W REFLEX): HIV: NONREACTIVE

## 2018-04-13 LAB — TSH: TSH: 1.31 m[IU]/L

## 2018-06-13 ENCOUNTER — Other Ambulatory Visit: Payer: Self-pay | Admitting: Obstetrics & Gynecology

## 2018-06-13 DIAGNOSIS — Z3491 Encounter for supervision of normal pregnancy, unspecified, first trimester: Secondary | ICD-10-CM

## 2018-06-13 DIAGNOSIS — O0991 Supervision of high risk pregnancy, unspecified, first trimester: Secondary | ICD-10-CM | POA: Diagnosis not present

## 2018-06-14 ENCOUNTER — Ambulatory Visit (INDEPENDENT_AMBULATORY_CARE_PROVIDER_SITE_OTHER): Payer: BLUE CROSS/BLUE SHIELD

## 2018-06-14 DIAGNOSIS — Z3687 Encounter for antenatal screening for uncertain dates: Secondary | ICD-10-CM | POA: Diagnosis not present

## 2018-06-14 DIAGNOSIS — Z3491 Encounter for supervision of normal pregnancy, unspecified, first trimester: Secondary | ICD-10-CM

## 2018-06-14 LAB — OB RESULTS CONSOLE ANTIBODY SCREEN: Antibody Screen: NEGATIVE

## 2018-06-14 LAB — OB RESULTS CONSOLE HGB/HCT, BLOOD: Hemoglobin: 11.5

## 2018-06-14 LAB — OB RESULTS CONSOLE GC/CHLAMYDIA
Chlamydia: NEGATIVE
Gonorrhea: NEGATIVE

## 2018-06-14 LAB — OB RESULTS CONSOLE ABO/RH: RH Type: POSITIVE

## 2018-06-14 LAB — OB RESULTS CONSOLE HIV ANTIBODY (ROUTINE TESTING): HIV: NONREACTIVE

## 2018-06-14 LAB — OB RESULTS CONSOLE RPR: RPR: NONREACTIVE

## 2018-06-14 LAB — OB RESULTS CONSOLE HEPATITIS B SURFACE ANTIGEN: HEP B S AG: NEGATIVE

## 2018-06-14 LAB — OB RESULTS CONSOLE PLATELET COUNT: Platelets: 178

## 2018-06-14 LAB — OB RESULTS CONSOLE TSH: TSH: 0.609

## 2018-07-12 DIAGNOSIS — O0992 Supervision of high risk pregnancy, unspecified, second trimester: Principal | ICD-10-CM

## 2018-07-12 DIAGNOSIS — O0990 Supervision of high risk pregnancy, unspecified, unspecified trimester: Secondary | ICD-10-CM | POA: Insufficient documentation

## 2018-07-24 ENCOUNTER — Other Ambulatory Visit: Payer: Self-pay | Admitting: Obstetrics and Gynecology

## 2018-07-24 DIAGNOSIS — Z3482 Encounter for supervision of other normal pregnancy, second trimester: Secondary | ICD-10-CM

## 2018-07-26 ENCOUNTER — Ambulatory Visit (INDEPENDENT_AMBULATORY_CARE_PROVIDER_SITE_OTHER): Payer: BLUE CROSS/BLUE SHIELD

## 2018-07-26 DIAGNOSIS — Z3482 Encounter for supervision of other normal pregnancy, second trimester: Secondary | ICD-10-CM

## 2018-07-26 DIAGNOSIS — Z363 Encounter for antenatal screening for malformations: Secondary | ICD-10-CM | POA: Diagnosis not present

## 2018-09-06 DIAGNOSIS — O99210 Obesity complicating pregnancy, unspecified trimester: Secondary | ICD-10-CM | POA: Insufficient documentation

## 2018-09-13 DIAGNOSIS — O99212 Obesity complicating pregnancy, second trimester: Secondary | ICD-10-CM

## 2018-09-13 DIAGNOSIS — O0992 Supervision of high risk pregnancy, unspecified, second trimester: Secondary | ICD-10-CM

## 2018-09-27 ENCOUNTER — Other Ambulatory Visit: Payer: Self-pay | Admitting: Obstetrics & Gynecology

## 2018-09-27 DIAGNOSIS — O0992 Supervision of high risk pregnancy, unspecified, second trimester: Secondary | ICD-10-CM

## 2018-10-04 ENCOUNTER — Ambulatory Visit (INDEPENDENT_AMBULATORY_CARE_PROVIDER_SITE_OTHER): Payer: BLUE CROSS/BLUE SHIELD

## 2018-10-04 ENCOUNTER — Other Ambulatory Visit: Payer: Self-pay

## 2018-10-04 DIAGNOSIS — Z23 Encounter for immunization: Secondary | ICD-10-CM | POA: Diagnosis not present

## 2018-10-04 DIAGNOSIS — O0992 Supervision of high risk pregnancy, unspecified, second trimester: Secondary | ICD-10-CM

## 2018-10-04 DIAGNOSIS — O0991 Supervision of high risk pregnancy, unspecified, first trimester: Secondary | ICD-10-CM | POA: Diagnosis not present

## 2018-10-04 DIAGNOSIS — Z363 Encounter for antenatal screening for malformations: Secondary | ICD-10-CM | POA: Diagnosis not present

## 2018-10-11 ENCOUNTER — Ambulatory Visit: Payer: BLUE CROSS/BLUE SHIELD | Admitting: Family Medicine

## 2018-10-18 ENCOUNTER — Encounter: Payer: Self-pay | Admitting: Family Medicine

## 2018-11-27 ENCOUNTER — Other Ambulatory Visit: Payer: Self-pay

## 2018-11-27 ENCOUNTER — Inpatient Hospital Stay: Payer: BLUE CROSS/BLUE SHIELD | Admitting: Anesthesiology

## 2018-11-27 ENCOUNTER — Other Ambulatory Visit: Payer: Self-pay | Admitting: Obstetrics and Gynecology

## 2018-11-27 ENCOUNTER — Encounter
Admission: RE | Disposition: A | Discharge status: Home / Self Care | Payer: Self-pay | Source: Home / Self Care | Attending: Obstetrics & Gynecology

## 2018-11-27 ENCOUNTER — Inpatient Hospital Stay
Admission: RE | Admit: 2018-11-27 | Discharge: 2018-11-30 | DRG: 787 | Disposition: A | Discharge status: Home / Self Care | Payer: BLUE CROSS/BLUE SHIELD | Attending: Obstetrics & Gynecology | Admitting: Obstetrics & Gynecology

## 2018-11-27 DIAGNOSIS — Z1159 Encounter for screening for other viral diseases: Secondary | ICD-10-CM

## 2018-11-27 DIAGNOSIS — O99214 Obesity complicating childbirth: Secondary | ICD-10-CM | POA: Diagnosis not present

## 2018-11-27 DIAGNOSIS — O99213 Obesity complicating pregnancy, third trimester: Secondary | ICD-10-CM

## 2018-11-27 DIAGNOSIS — O9081 Anemia of the puerperium: Secondary | ICD-10-CM | POA: Diagnosis not present

## 2018-11-27 DIAGNOSIS — O365930 Maternal care for other known or suspected poor fetal growth, third trimester, not applicable or unspecified: Secondary | ICD-10-CM | POA: Diagnosis not present

## 2018-11-27 DIAGNOSIS — D62 Acute posthemorrhagic anemia: Secondary | ICD-10-CM | POA: Diagnosis not present

## 2018-11-27 DIAGNOSIS — O1214 Gestational proteinuria, complicating childbirth: Secondary | ICD-10-CM | POA: Diagnosis present

## 2018-11-27 DIAGNOSIS — O368390 Maternal care for abnormalities of the fetal heart rate or rhythm, unspecified trimester, not applicable or unspecified: Secondary | ICD-10-CM | POA: Diagnosis present

## 2018-11-27 DIAGNOSIS — R809 Proteinuria, unspecified: Secondary | ICD-10-CM

## 2018-11-27 DIAGNOSIS — Z3A37 37 weeks gestation of pregnancy: Secondary | ICD-10-CM | POA: Diagnosis not present

## 2018-11-27 DIAGNOSIS — Z98891 History of uterine scar from previous surgery: Secondary | ICD-10-CM | POA: Diagnosis not present

## 2018-11-27 DIAGNOSIS — O1213 Gestational proteinuria, third trimester: Secondary | ICD-10-CM | POA: Diagnosis present

## 2018-11-27 LAB — COMPREHENSIVE METABOLIC PANEL
ALT: 19 U/L (ref 0–44)
AST: 27 U/L (ref 15–41)
Albumin: 2.9 g/dL — ABNORMAL LOW (ref 3.5–5.0)
Alkaline Phosphatase: 90 U/L (ref 38–126)
Anion gap: 8 (ref 5–15)
BUN: 12 mg/dL (ref 6–20)
CO2: 20 mmol/L — ABNORMAL LOW (ref 22–32)
Calcium: 8.2 mg/dL — ABNORMAL LOW (ref 8.9–10.3)
Chloride: 109 mmol/L (ref 98–111)
Creatinine, Ser: 0.56 mg/dL (ref 0.44–1.00)
GFR calc Af Amer: >60 mL/min (ref >60)
GFR calc non Af Amer: >60 mL/min (ref >60)
Glucose, Bld: 140 mg/dL — ABNORMAL HIGH (ref 70–99)
Potassium: 3.3 mmol/L — ABNORMAL LOW (ref 3.5–5.1)
Sodium: 137 mmol/L (ref 135–145)
Total Bilirubin: 0.3 mg/dL (ref 0.3–1.2)
Total Protein: 6 g/dL — ABNORMAL LOW (ref 6.5–8.1)

## 2018-11-27 LAB — SARS CORONAVIRUS 2 BY RT PCR (HOSPITAL ORDER, PERFORMED IN ~~LOC~~ HOSPITAL LAB): SARS Coronavirus 2: NEGATIVE

## 2018-11-27 LAB — URINALYSIS, ROUTINE W REFLEX MICROSCOPIC
Bilirubin Urine: NEGATIVE
Glucose, UA: NEGATIVE mg/dL
Hgb urine dipstick: NEGATIVE
Ketones, ur: NEGATIVE mg/dL
Leukocytes,Ua: NEGATIVE
Nitrite: NEGATIVE
Protein, ur: 30 mg/dL — AB
Specific Gravity, Urine: 1.005 (ref 1.005–1.030)
pH: 6 (ref 5.0–8.0)

## 2018-11-27 LAB — SAMPLE TO BLOOD BANK

## 2018-11-27 LAB — CBC
HCT: 34.5 % — ABNORMAL LOW (ref 36.0–46.0)
Hemoglobin: 11.9 g/dL — ABNORMAL LOW (ref 12.0–15.0)
MCH: 30.3 pg (ref 26.0–34.0)
MCHC: 34.5 g/dL (ref 30.0–36.0)
MCV: 87.8 fL (ref 80.0–100.0)
Platelets: 139 10*3/uL — ABNORMAL LOW (ref 150–400)
RBC: 3.93 MIL/uL (ref 3.87–5.11)
RDW: 13.2 % (ref 11.5–15.5)
WBC: 8.9 10*3/uL (ref 4.0–10.5)
nRBC: 0 % (ref 0.0–0.2)

## 2018-11-27 LAB — PROTEIN / CREATININE RATIO, URINE
Creatinine, Urine: 27 mg/dL
Protein Creatinine Ratio: 1.04 mg/mg{Cre} — ABNORMAL HIGH (ref 0.00–0.15)
Total Protein, Urine: 28 mg/dL

## 2018-11-27 LAB — TYPE AND SCREEN
ABO/RH(D): A POS
Antibody Screen: NEGATIVE

## 2018-11-27 SURGERY — Surgical Case
Anesthesia: Spinal

## 2018-11-27 MED ORDER — ZOLPIDEM TARTRATE 5 MG PO TABS: 5.0000 mg | ORAL_TABLET | Freq: Every evening | ORAL | Status: DC | PRN

## 2018-11-27 MED ORDER — SODIUM CHLORIDE 0.9% FLUSH: 3.0000 mL | INTRAVENOUS | Status: DC | PRN

## 2018-11-27 MED ORDER — OXYTOCIN 40 UNITS IN NORMAL SALINE INFUSION - SIMPLE MED
INTRAVENOUS | Status: DC | PRN
  Administered 2018-11-27: 299 mL via INTRAVENOUS
  Administered 2018-11-27: 1 mL via INTRAVENOUS

## 2018-11-27 MED ORDER — BUPIVACAINE HCL (PF) 0.5 % IJ SOLN
INTRAMUSCULAR | Status: DC | PRN
  Administered 2018-11-27: 10 mL

## 2018-11-27 MED ORDER — LACTATED RINGERS IV SOLN
INTRAVENOUS | Status: DC
  Administered 2018-11-27: 21:00:00 via INTRAVENOUS

## 2018-11-27 MED ORDER — NALBUPHINE HCL 10 MG/ML IJ SOLN: 5.0000 mg | INTRAMUSCULAR | Status: DC | PRN

## 2018-11-27 MED ORDER — KETOROLAC TROMETHAMINE 30 MG/ML IJ SOLN: 30.0000 mg | Freq: Four times a day (QID) | INTRAMUSCULAR | Status: AC | PRN

## 2018-11-27 MED ORDER — ONDANSETRON HCL 4 MG/2ML IJ SOLN: 4.0000 mg | Freq: Three times a day (TID) | INTRAMUSCULAR | Status: DC | PRN

## 2018-11-27 MED ORDER — LIDOCAINE HCL (PF) 1 % IJ SOLN: 30.0000 mL | INTRAMUSCULAR | Status: DC | PRN

## 2018-11-27 MED ORDER — LACTATED RINGERS IV SOLN: INTRAVENOUS | Status: DC

## 2018-11-27 MED ORDER — SIMETHICONE 80 MG PO CHEW
80.0000 mg | CHEWABLE_TABLET | Freq: Three times a day (TID) | ORAL | Status: DC
  Administered 2018-11-28 – 2018-11-30 (×8): 80 mg via ORAL
  Filled 2018-11-27 (×7): qty 1

## 2018-11-27 MED ORDER — SENNOSIDES-DOCUSATE SODIUM 8.6-50 MG PO TABS
2.0000 | ORAL_TABLET | ORAL | Status: DC
  Administered 2018-11-28 – 2018-11-29 (×2): 2 via ORAL
  Filled 2018-11-27 (×3): qty 2

## 2018-11-27 MED ORDER — BUPIVACAINE IN DEXTROSE 0.75-8.25 % IT SOLN
INTRATHECAL | Status: DC | PRN
  Administered 2018-11-27: 1.7 mL via INTRATHECAL

## 2018-11-27 MED ORDER — TETANUS-DIPHTH-ACELL PERTUSSIS 5-2.5-18.5 LF-MCG/0.5 IM SUSP: 0.5000 mL | Freq: Once | INTRAMUSCULAR | Status: DC

## 2018-11-27 MED ORDER — LACTATED RINGERS IV SOLN: 500.0000 mL | INTRAVENOUS | Status: DC | PRN

## 2018-11-27 MED ORDER — ONDANSETRON HCL 4 MG/2ML IJ SOLN
INTRAMUSCULAR | Status: DC | PRN
  Administered 2018-11-27: 4 mg via INTRAVENOUS

## 2018-11-27 MED ORDER — IBUPROFEN 800 MG PO TABS
800.0000 mg | ORAL_TABLET | Freq: Four times a day (QID) | ORAL | Status: DC
  Administered 2018-11-29 – 2018-11-30 (×6): 800 mg via ORAL
  Filled 2018-11-27 (×6): qty 1

## 2018-11-27 MED ORDER — ONDANSETRON HCL 4 MG/2ML IJ SOLN
INTRAMUSCULAR | Status: AC
  Filled 2018-11-27: qty 2

## 2018-11-27 MED ORDER — SODIUM CHLORIDE 0.9 % IV SOLN
INTRAVENOUS | Status: DC | PRN
  Administered 2018-11-27: 21:00:00 30 ug/min via INTRAVENOUS

## 2018-11-27 MED ORDER — OXYTOCIN 40 UNITS IN NORMAL SALINE INFUSION - SIMPLE MED
INTRAVENOUS | Status: AC
  Filled 2018-11-27: qty 1000

## 2018-11-27 MED ORDER — OXYTOCIN 40 UNITS IN NORMAL SALINE INFUSION - SIMPLE MED: 2.5000 [IU]/h | INTRAVENOUS | Status: DC

## 2018-11-27 MED ORDER — BUPIVACAINE 0.25 % ON-Q PUMP DUAL CATH 400 ML
400.0000 mL | INJECTION | Status: DC
  Filled 2018-11-27: qty 400

## 2018-11-27 MED ORDER — DIPHENHYDRAMINE HCL 25 MG PO CAPS: 25.0000 mg | ORAL_CAPSULE | ORAL | Status: DC | PRN

## 2018-11-27 MED ORDER — WITCH HAZEL-GLYCERIN EX PADS: 1.0000 "application " | MEDICATED_PAD | CUTANEOUS | Status: DC | PRN

## 2018-11-27 MED ORDER — ONDANSETRON HCL 4 MG/2ML IJ SOLN: 4.0000 mg | Freq: Four times a day (QID) | INTRAMUSCULAR | Status: DC | PRN

## 2018-11-27 MED ORDER — ONDANSETRON HCL 4 MG/2ML IJ SOLN: 4.0000 mg | Freq: Once | INTRAMUSCULAR | Status: DC | PRN

## 2018-11-27 MED ORDER — OXYCODONE-ACETAMINOPHEN 5-325 MG PO TABS: 1.0000 | ORAL_TABLET | ORAL | Status: DC | PRN

## 2018-11-27 MED ORDER — BUPIVACAINE HCL 0.5 % IJ SOLN
10.0000 mL | Freq: Once | INTRAMUSCULAR | Status: DC
  Filled 2018-11-27: qty 10

## 2018-11-27 MED ORDER — GLYCOPYRROLATE 0.2 MG/ML IJ SOLN
INTRAMUSCULAR | Status: AC
  Filled 2018-11-27: qty 1

## 2018-11-27 MED ORDER — PROPOFOL 10 MG/ML IV BOLUS
INTRAVENOUS | Status: AC
  Filled 2018-11-27: qty 20

## 2018-11-27 MED ORDER — ACETAMINOPHEN 325 MG PO TABS: 650.0000 mg | ORAL_TABLET | ORAL | Status: DC | PRN

## 2018-11-27 MED ORDER — SIMETHICONE 80 MG PO CHEW
80.0000 mg | CHEWABLE_TABLET | ORAL | Status: DC
  Filled 2018-11-27 (×2): qty 1

## 2018-11-27 MED ORDER — COCONUT OIL OIL: 1.0000 "application " | TOPICAL_OIL | Status: DC | PRN

## 2018-11-27 MED ORDER — MORPHINE SULFATE (PF) 0.5 MG/ML IJ SOLN
INTRAMUSCULAR | Status: AC
  Filled 2018-11-27: qty 10

## 2018-11-27 MED ORDER — SOD CITRATE-CITRIC ACID 500-334 MG/5ML PO SOLN
30.0000 mL | ORAL | Status: AC
  Administered 2018-11-27: 20:00:00 30 mL via ORAL

## 2018-11-27 MED ORDER — SOD CITRATE-CITRIC ACID 500-334 MG/5ML PO SOLN
ORAL | Status: AC
  Filled 2018-11-27: qty 30

## 2018-11-27 MED ORDER — DIPHENHYDRAMINE HCL 50 MG/ML IJ SOLN: 12.5000 mg | INTRAMUSCULAR | Status: DC | PRN

## 2018-11-27 MED ORDER — KETOROLAC TROMETHAMINE 30 MG/ML IJ SOLN
30.0000 mg | Freq: Four times a day (QID) | INTRAMUSCULAR | Status: AC
  Administered 2018-11-28 (×3): 30 mg via INTRAVENOUS
  Filled 2018-11-27 (×3): qty 1

## 2018-11-27 MED ORDER — BUPIVACAINE ON-Q PAIN PUMP (FOR ORDER SET NO CHG): INJECTION | Status: DC

## 2018-11-27 MED ORDER — LACTATED RINGERS IV SOLN
INTRAVENOUS | Status: DC
  Administered 2018-11-27: 18:00:00 via INTRAVENOUS

## 2018-11-27 MED ORDER — ACETAMINOPHEN 500 MG PO TABS
1000.0000 mg | ORAL_TABLET | Freq: Four times a day (QID) | ORAL | Status: AC
  Administered 2018-11-28 (×2): 1000 mg via ORAL
  Filled 2018-11-27 (×2): qty 2

## 2018-11-27 MED ORDER — BUPIVACAINE HCL (PF) 0.5 % IJ SOLN
INTRAMUSCULAR | Status: AC
  Filled 2018-11-27: qty 30

## 2018-11-27 MED ORDER — LACTATED RINGERS IV BOLUS
500.0000 mL | Freq: Once | INTRAVENOUS | Status: AC
  Administered 2018-11-27: 500 mL via INTRAVENOUS

## 2018-11-27 MED ORDER — NALOXONE HCL 0.4 MG/ML IJ SOLN: 0.4000 mg | INTRAMUSCULAR | Status: DC | PRN

## 2018-11-27 MED ORDER — MORPHINE SULFATE (PF) 0.5 MG/ML IJ SOLN
INTRAMUSCULAR | Status: DC | PRN
  Administered 2018-11-27: .2 mg via EPIDURAL

## 2018-11-27 MED ORDER — PRENATAL MULTIVITAMIN CH
1.0000 | ORAL_TABLET | Freq: Every day | ORAL | Status: DC
  Administered 2018-11-28 – 2018-11-30 (×3): 1 via ORAL
  Filled 2018-11-27 (×3): qty 1

## 2018-11-27 MED ORDER — OXYTOCIN 40 UNITS IN NORMAL SALINE INFUSION - SIMPLE MED: 2.5000 [IU]/h | INTRAVENOUS | Status: AC

## 2018-11-27 MED ORDER — SODIUM CHLORIDE 0.9 % IV SOLN
INTRAVENOUS | Status: AC
  Filled 2018-11-27: qty 2

## 2018-11-27 MED ORDER — DIPHENHYDRAMINE HCL 25 MG PO CAPS: 25.0000 mg | ORAL_CAPSULE | Freq: Four times a day (QID) | ORAL | Status: DC | PRN

## 2018-11-27 MED ORDER — MENTHOL 3 MG MT LOZG
1.0000 | LOZENGE | OROMUCOSAL | Status: DC | PRN
  Filled 2018-11-27: qty 9

## 2018-11-27 MED ORDER — FENTANYL CITRATE (PF) 100 MCG/2ML IJ SOLN: 25.0000 ug | INTRAMUSCULAR | Status: DC | PRN

## 2018-11-27 MED ORDER — DIBUCAINE (PERIANAL) 1 % EX OINT: 1.0000 "application " | TOPICAL_OINTMENT | CUTANEOUS | Status: DC | PRN

## 2018-11-27 MED ORDER — ACETAMINOPHEN 325 MG PO TABS
650.0000 mg | ORAL_TABLET | ORAL | Status: DC | PRN
  Administered 2018-11-28 – 2018-11-30 (×4): 650 mg via ORAL
  Filled 2018-11-27 (×4): qty 2

## 2018-11-27 MED ORDER — SODIUM CHLORIDE 0.9 % IV SOLN
2.0000 g | INTRAVENOUS | Status: AC
  Administered 2018-11-27: 2 g via INTRAVENOUS

## 2018-11-27 MED ORDER — SIMETHICONE 80 MG PO CHEW: 80.0000 mg | CHEWABLE_TABLET | ORAL | Status: DC | PRN

## 2018-11-27 MED ORDER — MORPHINE SULFATE (PF) 2 MG/ML IV SOLN: 1.0000 mg | INTRAVENOUS | Status: DC | PRN

## 2018-11-27 MED ORDER — MEPERIDINE HCL 25 MG/ML IJ SOLN: 6.2500 mg | INTRAMUSCULAR | Status: DC | PRN

## 2018-11-27 MED ORDER — OXYTOCIN BOLUS FROM INFUSION: 500.0000 mL | Freq: Once | INTRAVENOUS | Status: DC

## 2018-11-27 SURGICAL SUPPLY — 27 items
CANISTER SUCT 3000ML PPV (MISCELLANEOUS) ×2 IMPLANT
CATH KIT ON-Q SILVERSOAK 5IN (CATHETERS) ×4 IMPLANT
CHLORAPREP W/TINT 26 (MISCELLANEOUS) ×4 IMPLANT
COVER WAND RF STERILE (DRAPES) IMPLANT
DERMABOND ADVANCED (GAUZE/BANDAGES/DRESSINGS) ×1
DERMABOND ADVANCED .7 DNX12 (GAUZE/BANDAGES/DRESSINGS) ×1 IMPLANT
DRSG OPSITE POSTOP 4X10 (GAUZE/BANDAGES/DRESSINGS) ×2 IMPLANT
ELECT CAUTERY BLADE 6.4 (BLADE) ×2 IMPLANT
ELECT REM PT RETURN 9FT ADLT (ELECTROSURGICAL) ×2
ELECTRODE REM PT RTRN 9FT ADLT (ELECTROSURGICAL) ×1 IMPLANT
GLOVE SKINSENSE NS SZ8.0 LF (GLOVE) ×6
GLOVE SKINSENSE STRL SZ8.0 LF (GLOVE) ×6 IMPLANT
GOWN STRL REUS W/ TWL LRG LVL3 (GOWN DISPOSABLE) ×1 IMPLANT
GOWN STRL REUS W/ TWL XL LVL3 (GOWN DISPOSABLE) ×2 IMPLANT
GOWN STRL REUS W/TWL LRG LVL3 (GOWN DISPOSABLE) ×1
GOWN STRL REUS W/TWL XL LVL3 (GOWN DISPOSABLE) ×2
NS IRRIG 1000ML POUR BTL (IV SOLUTION) ×2 IMPLANT
PACK C SECTION AR (MISCELLANEOUS) ×2 IMPLANT
PAD OB MATERNITY 4.3X12.25 (PERSONAL CARE ITEMS) ×2 IMPLANT
PAD PREP 24X41 OB/GYN DISP (PERSONAL CARE ITEMS) ×2 IMPLANT
PENCIL SMOKE ULTRAEVAC 22 CON (MISCELLANEOUS) IMPLANT
STRIP CLOSURE SKIN 1/2X4 (GAUZE/BANDAGES/DRESSINGS) ×2 IMPLANT
SUT MAXON ABS #0 GS21 30IN (SUTURE) ×4 IMPLANT
SUT PLAIN 2 0 XLH (SUTURE) ×2 IMPLANT
SUT VIC AB 1 CT1 36 (SUTURE) ×6 IMPLANT
SUT VIC AB 2-0 CT1 36 (SUTURE) ×2 IMPLANT
SUT VIC AB 4-0 FS2 27 (SUTURE) ×2 IMPLANT

## 2018-11-27 NOTE — Anesthesia Preprocedure Evaluation (Signed)
Anesthesia Evaluation  Patient identified by MRN, date of birth, ID band Patient awake    Reviewed: Allergy & Precautions, NPO status , Patient's Chart, lab work & pertinent test results, reviewed documented beta blocker date and time   Airway Mallampati: III  TM Distance: >3 FB     Dental  (+) Chipped   Pulmonary           Cardiovascular      Neuro/Psych    GI/Hepatic   Endo/Other  Morbid obesity  Renal/GU      Musculoskeletal   Abdominal   Peds  Hematology   Anesthesia Other Findings Platelets 139. Hb 11.9. K 3.3.  Reproductive/Obstetrics                             Anesthesia Physical Anesthesia Plan  ASA: III  Anesthesia Plan: Spinal   Post-op Pain Management:    Induction:   PONV Risk Score and Plan:   Airway Management Planned:   Additional Equipment:   Intra-op Plan:   Post-operative Plan:   Informed Consent: I have reviewed the patients History and Physical, chart, labs and discussed the procedure including the risks, benefits and alternatives for the proposed anesthesia with the patient or authorized representative who has indicated his/her understanding and acceptance.       Plan Discussed with: CRNA  Anesthesia Plan Comments:         Anesthesia Quick Evaluation

## 2018-11-27 NOTE — Discharge Summary (Signed)
OB Discharge Summary     Patient Name: Tiffany Carpenter DOB: 06/26/1991 MRN: [id number]  Date of admission: 11/27/2018 Delivering MD: Letitia Libraobert Paul Harris, MD  Date of Delivery: 11/27/2018  Date of discharge: 11/30/2018  Admitting diagnosis: 37 weeks, Non reassuring fetal heart rate Intrauterine pregnancy: 709w2d     Secondary diagnosis: None     Discharge diagnosis: Term Pregnancy Delivered, Reasons for cesarean section  Non-reassuring FHR  , Intrauterine growth restriction, Proteinuria                       Hospital course:  Onset of Labor With Unplanned C/S  28 y.o. yo G2P1001 at 119w2d was admitted with concerns over office NST fetal heart rate tracing on 11/27/2018. Patient had a labor course significant for no ctxs but concerning decelerations . Patient had normal blood pressures but her PC ratio was elevated at 1040 mgm protein.  Membrane Rupture Time/Date: none,    The patient went for cesarean section due to Non-Reassuring FHR, and delivered a Viable female infant,11/27/2018  Details of operation can be found in separate operative note. Patient had an uncomplicated postpartum course.  She is ambulating,tolerating a regular diet, passing flatus, and urinating well.  Patient is discharged home in stable condition 11/30/18.                                                                 Post partum procedures:none  Complications: None  Physical exam on 11/30/2018: Vitals:   11/29/18 0850 11/29/18 1616 11/29/18 2323 11/30/18 1054  BP: 120/76 120/75 127/70 139/78  Pulse: 70 67 69 69  Resp: 18 18 19 18   Temp: 98 F (36.7 C) 98.8 F (37.1 C) 98.5 F (36.9 C) 98 F (36.7 C)  TempSrc: Oral Oral Oral Oral  SpO2: 99% 99% 99% 100%  Weight:      Height:       General: alert, cooperative and no distress Passing flatus. Had BM. Baby remains in NICU and is receiving tube feeding. Patient started pumping breast milk. Heart: RRR without murmur Lungs: CTAB, normal respiratory effort Lochia:  appropriate Abdomen: Fundus firm/ U-1/ML/NT, bowel sounds active, soft, NT Incision: Honeycomb dressing C&D&I, ON Q intact DVT Evaluation: No evidence of DVT seen on physical exam.  Labs: Lab Results  Component Value Date   WBC 14.1 (H) 11/28/2018   HGB 11.9 (L) 11/28/2018   HCT 35.3 (L) 11/28/2018   MCV 89.1 11/28/2018   PLT 136 (L) 11/28/2018   CMP Latest Ref Rng & Units 11/27/2018  Glucose 70 - 99 mg/dL 409(W140(H)  BUN 6 - 20 mg/dL 12  Creatinine 1.190.44 - 1.471.00 mg/dL 8.290.56  Sodium 562135 - 130145 mmol/L 137  Potassium 3.5 - 5.1 mmol/L 3.3(L)  Chloride 98 - 111 mmol/L 109  CO2 22 - 32 mmol/L 20(L)  Calcium 8.9 - 10.3 mg/dL 8.2(L)  Total Protein 6.5 - 8.1 g/dL 6.0(L)  Total Bilirubin 0.3 - 1.2 mg/dL 0.3  Alkaline Phos 38 - 126 U/L 90  AST 15 - 41 U/L 27  ALT 0 - 44 U/L 19   Creatinine, Urine mg/dL 27   Total Protein, Urine mg/dL 28   Comment: NO NORMAL RANGE ESTABLISHED FOR THIS TEST  Protein Creatinine Ratio 0.00 - 0.15 mg/mg  1.04High    Comment: Performed at [facility], [address] [place], [place] [address]  Resulting Agency  [facility]     Discharge instruction: per After Visit Summary.  Medications:  Allergies as of 11/30/2018   No Known Allergies     Medication List    TAKE these medications   acetaminophen 500 MG tablet Commonly known as:  TYLENOL Take 2 tablets (1,000 mg total) by mouth every 6 (six) hours as needed for mild pain or moderate pain.   ibuprofen 600 MG tablet Commonly known as:  ADVIL Take 1 tablet (600 mg total) by mouth every 6 (six) hours as needed for headache, mild pain, moderate pain or cramping.   oxyCODONE 5 MG immediate release tablet Commonly known as:  Roxicodone Take 1 tablet (5 mg total) by mouth every 6 (six) hours as needed for up to 3 days for severe pain.   PRENATAL VITAMIN PO Take 1 tablet by mouth daily.       Diet: routine diet  Activity: Advance as tolerated. Pelvic rest for 6 weeks.   Outpatient follow  up: Follow-up Information    Nadara Mustard, MD. Schedule an appointment as soon as possible for a visit in 1 week(s).   Specialty:  Obstetrics and Gynecology Why:  for incision check Contact information: [address] [place] [address] [phone]        [facility] [place] DEPARTMENT. Schedule an appointment as soon as possible for a visit in 6 week(s).   Why:  for post partum check up Contact information: [address] [place] [address]            Postpartum contraception: Nexplanon Rhogam Given postpartum: no Rubella vaccine given postpartum: no Varicella vaccine given postpartum: no TDaP given antepartum or postpartum: Yes 10/04/2018  Newborn Data: Live born female Noah Birth Weight: 3 lb 15.5 oz (1800 g) APGAR: 8,8   Newborn Delivery   Birth date/time:  11/27/2018 21:04:00 Delivery type:  C-Section, Low Vertical Trial of labor:  No C-section categorization:  Primary      Baby Feeding: Breast  Disposition:NICU  SIGNED:  Farrel Conners, CNM 11/30/2018 11:23 AM

## 2018-11-27 NOTE — Anesthesia Post-op Follow-up Note (Signed)
Anesthesia QCDR form completed.        

## 2018-11-27 NOTE — OB Triage Note (Signed)
Pt. sent over from [facility] for a Non-Stress Test. We did not receive a phone call alerting [place] that she would be coming, but she arrived with a print-out of her fetal strip from the HD. Monitors applied, triage questions completed, and will continue to assess. Pt. states she goes to [facility] for her fetal ultrasounds. Will alert Dr. Tiburcio Pea that pt. is on the unit.

## 2018-11-27 NOTE — H&P (Signed)
Obstetrics Admission History & Physical   No chief complaint on file.   HPI:  28 y.o. G2P1001 @ 5110w2d (12/16/2018, by Ultrasound). Admitted on 11/27/2018:   Patient Active Problem List   Diagnosis Date Noted  . [redacted] weeks gestation of pregnancy 11/27/2018  . Obesity complicating pregnancy 09/06/2018  . Supervision of high risk pregnancy in second trimester 07/12/2018  . Low birth weight infant 06/13/2018  . Class 3 severe obesity due to excess calories without serious comorbidity with body mass index (BMI) of 40.0 to 44.9 in adult (HCC) 04/12/2018  . History of anemia 04/12/2018  . Plantar fasciitis 10/15/2015  . Achilles tendinitis of right lower extremity 10/15/2015     Presents for Non-Reactive NST w 2 decels noted in clinic today.  No pain or VB or ROM.  Good FM.  Prior NSVD 8 yrs ago.   Prenatal care at: at [facility]. Pregnancy complicated by none.  ROS: A review of systems was performed and negative, except as stated in the above HPI. PMHx: History reviewed. No pertinent past medical history. PSHx: History reviewed. No pertinent surgical history. Medications:  Medications Prior to Admission  Medication Sig Dispense Refill Last Dose  . Prenatal Vit-Fe Fumarate-FA (PRENATAL VITAMIN PO) Take 1 tablet by mouth daily.   11/26/2018   Allergies: has No Known Allergies. OBHx:  OB History  Gravida Para Term Preterm AB Living  2 1 1     1   SAB TAB Ectopic Multiple Live Births          1    # Outcome Date GA Lbr Len/2nd Weight Sex Delivery Anes PTL Lv  2 Current           1 Term 12/23/11 3825w4d  2466 g M Vag-Spont  N LIV   ZOX:[id number]:Negative/unremarkable except as detailed in HPI.Marland Kitchen.  No family history of birth defects. Soc Hx: Never smoker and Recreational drug use: none  Objective:   Vitals:   11/27/18 1724  BP: (!) 125/57  Pulse: 67  Resp: 18  Temp: 98.6 F (37 C)   Constitutional: Well nourished, well developed female in no acute distress.  HEENT: normal Skin: Warm and dry.   Cardiovascular:Regular rate and rhythm.   Extremity: trace to 1+ bilateral pedal edema Respiratory: Clear to auscultation bilateral. Normal respiratory effort Abdomen: gravid, ND, FHT present, mild tenderness on exam Back: no CVAT Neuro: DTRs 2+, Cranial nerves grossly intact Psych: Alert and Oriented x3. No memory deficits. Normal mood and affect.  MS: normal gait, normal bilateral lower extremity ROM/strength/stability.  Pelvic exam: is not limited by body habitus EGBUS: within normal limits Vagina: within normal limits and with normal mucosa Cervix: CERVIX: 1 cm dilated, 10 effaced, -3 station Uterus: Uterus demonstrates irritability pattern.  Adnexa: not evaluated  EFM:FHR: 140 bpm, variability: minimal ,  accelerations:  Abscent,  decelerations:  Present no ctx so no assoc w ctx but have late appearance w 2-3 per hour Toco: rare   Perinatal info:  Blood type: A positive Rubella- Unknown Varicella -Unknown TDaP tetanus status unknown to the patient RPR NR / HIV Neg/ HBsAg Neg   Assessment & Plan:   28 y.o. G2P1001 @ 710w2d, Admitted on 11/27/2018: Non-reassuring fetal heart rate, not in labor    Pros and cons of Cesarean vs labor induction with concerns for fetal tolerence of labor as we are not in labor yet.  No s/sx preeclampsia, abruption, bleeding, infection, trauma.   The risks of cesarean section discussed with the patient included  but were not limited to: bleeding which may require transfusion or reoperation; infection which may require antibiotics; injury to bowel, bladder, ureters or other surrounding organs; injury to the fetus; need for additional procedures including hysterectomy in the event of a life-threatening hemorrhage; placental abnormalities wth subsequent pregnancies, incisional problems, thromboembolic phenomenon and other postoperative/anesthesia complications. The patient concurred with the proposed plan, giving informed written consent for the procedure.    Annamarie Major, MD, [facility], [facility] 11/27/2018  8:01 PM

## 2018-11-27 NOTE — Op Note (Signed)
Cesarean Section Procedure Note Indications: non-reassuring fetal status and term intrauterine pregnancy  Pre-operative Diagnosis: Intrauterine pregnancy [redacted]w[redacted]d ;  non-reassuring fetal status and term intrauterine pregnancy Post-operative Diagnosis: same, delivered. Procedure: Low Transverse Cesarean Section Surgeon: Annamarie Major, MD, FACOG Assistant(s): [address] Anesthesia: Spinal anesthesia Estimated Blood Loss:500 Complications: None; patient tolerated the procedure well. Disposition: PACU - hemodynamically stable. Condition: stable  Findings: A female infant in the cephalic presentation. Amniotic fluid - Meconium fluid; placenta without staining Birth weight pending  Apgars of 8 and 9.  Intact placenta with a three-vessel cord. Grossly normal uterus, tubes and ovaries bilaterally. No intraabdominal adhesions were noted.  Procedure Details   The patient was taken to Operating Room, identified as the correct patient and the procedure verified as C-Section Delivery. A Time Out was held and the above information confirmed. After induction of anesthesia, the patient was draped and prepped in the usual sterile manner. A Pfannenstiel incision was made and carried down through the subcutaneous tissue to the fascia. Fascial incision was made and extended transversely with the Mayo scissors. The fascia was separated from the underlying rectus tissue superiorly and inferiorly. The peritoneum was identified and entered bluntly. Peritoneal incision was extended longitudinally. The utero-vesical peritoneal reflection was incised transversely and a bladder flap was created digitally.  A low transverse hysterotomy was made. The fetus was delivered atraumatically. The umbilical cord was clamped x2 and cut and the infant was handed to the awaiting pediatricians. The placenta was removed intact and appeared normal with a 3-vessel cord.  The uterus was exteriorized and cleared of all clot and debris. The  hysterotomy was closed with running sutures of 0 Vicryl suture. A second imbricating layer was placed with the same suture. Excellent hemostasis was observed. The uterus was returned to the abdomen. The pelvis was irrigated and again, excellent hemostasis was noted.  The On Q Pain pump System was then placed.  Trocars were placed through the abdominal wall into the subfascial space and these were used to thread the silver soaker cathaters into place.The rectus fascia was then reapproximated with running sutures of Maxon, with careful placement not to incorporate the cathaters. Subcutaneous tissues are then irrigated with saline and hemostasis assured.  Skin is then closed with 4-0 vicryl suture in a subcuticular fashion followed by skin adhesive. The cathaters are flushed each with 5 mL of Bupivicaine and stabilized into place with dressing. Instrument, sponge, and needle counts were correct prior to the abdominal closure and at the conclusion of the case.  The patient tolerated the procedure well and was transferred to the recovery room in stable condition.   Annamarie Major, MD, [facility], [facility] 11/27/2018  9:40 PM

## 2018-11-27 NOTE — Transfer of Care (Signed)
Immediate Anesthesia Transfer of Care Note  Patient: Tiffany Carpenter  Procedure(s) Performed: CESAREAN SECTION (N/A )  Patient Location: PACU  Anesthesia Type:Spinal  Level of Consciousness: awake, alert , oriented and patient cooperative  Airway & Oxygen Therapy: Patient Spontanous Breathing  Post-op Assessment: Report given to RN  Post vital signs: Reviewed and stable  Last Vitals:  Vitals Value Taken Time  BP 110/59 11/27/2018  9:47 PM  Temp    Pulse 60 11/27/2018  9:51 PM  Resp 16 11/27/2018  9:51 PM  SpO2 100 % 11/27/2018  9:51 PM  Vitals shown include unvalidated device data.  Last Pain:  Vitals:   11/27/18 1904  TempSrc:   PainSc: 3       Patients Stated Pain Goal: 0 (11/27/18 1904)  Complications: No apparent anesthesia complications

## 2018-11-28 ENCOUNTER — Encounter: Payer: Self-pay | Admitting: Obstetrics & Gynecology

## 2018-11-28 LAB — CBC
HCT: 35.3 % — ABNORMAL LOW (ref 36.0–46.0)
Hemoglobin: 11.9 g/dL — ABNORMAL LOW (ref 12.0–15.0)
MCH: 30.1 pg (ref 26.0–34.0)
MCHC: 33.7 g/dL (ref 30.0–36.0)
MCV: 89.1 fL (ref 80.0–100.0)
Platelets: 136 10*3/uL — ABNORMAL LOW (ref 150–400)
RBC: 3.96 MIL/uL (ref 3.87–5.11)
RDW: 13.2 % (ref 11.5–15.5)
WBC: 14.1 10*3/uL — ABNORMAL HIGH (ref 4.0–10.5)
nRBC: 0 % (ref 0.0–0.2)

## 2018-11-28 MED ORDER — PROMETHAZINE HCL 25 MG RE SUPP
25.0000 mg | Freq: Once | RECTAL | Status: AC
  Administered 2018-11-28: 25 mg via RECTAL
  Filled 2018-11-28: qty 1

## 2018-11-28 NOTE — Anesthesia Postprocedure Evaluation (Signed)
Anesthesia Post Note  Patient: Linley Heinert  Procedure(s) Performed: CESAREAN SECTION (N/A )  Patient location during evaluation: Mother Baby Anesthesia Type: Spinal Level of consciousness: awake and alert and oriented Pain management: pain level controlled Vital Signs Assessment: post-procedure vital signs reviewed and stable Respiratory status: spontaneous breathing and nonlabored ventilation Cardiovascular status: stable Postop Assessment: no headache, no backache, patient able to bend at knees, no apparent nausea or vomiting, adequate PO intake and able to ambulate Anesthetic complications: no     Last Vitals:  Vitals:   11/28/18 0600 11/28/18 0700  BP:    Pulse: 73 68  Resp:    Temp:    SpO2: 100% 99%    Last Pain:  Vitals:   11/28/18 0750  TempSrc:   PainSc: 0-No pain                 Zachary George

## 2018-11-28 NOTE — Progress Notes (Signed)
Subjective:   Post Op Day 1: Patient reports doing well. She is tolerating regular diet. Her pain is controlled with PO medication and On Q pump. She has not yet ambulated and her foley catheter is still intact due to the timing of her surgery.   Objective:  Blood pressure 133/82, pulse 61, temperature 98.7 F (37.1 C), temperature source Oral, resp. rate 20, height 5\' 5"  (1.651 m), weight 126.6 kg, last menstrual period 04/05/2018, SpO2 98 %  General: NAD Pulmonary: no increased work of breathing Abdomen: non-distended, non-tender, fundus firm at level of umbilicus Incision: honeycomb dressing is C/D/I, On Q pump intact Extremities: no edema, no erythema, no tenderness  Results for orders placed or performed during the hospital encounter of 11/27/18 (from the past 24 hour(s))  Urinalysis, Routine w reflex microscopic     Status: Abnormal   Collection Time: 11/27/18  4:28 PM  Result Value Ref Range   Color, Urine STRAW (A) YELLOW   APPearance CLEAR (A) CLEAR   Specific Gravity, Urine 1.005 1.005 - 1.030   pH 6.0 5.0 - 8.0   Glucose, UA NEGATIVE NEGATIVE mg/dL   Hgb urine dipstick NEGATIVE NEGATIVE   Bilirubin Urine NEGATIVE NEGATIVE   Ketones, ur NEGATIVE NEGATIVE mg/dL   Protein, ur 30 (A) NEGATIVE mg/dL   Nitrite NEGATIVE NEGATIVE   Leukocytes,Ua NEGATIVE NEGATIVE   WBC, UA 0-5 0 - 5 WBC/hpf   Bacteria, UA RARE (A) NONE SEEN   Squamous Epithelial / LPF 0-5 0 - 5  Protein / creatinine ratio, urine     Status: Abnormal   Collection Time: 11/27/18  6:37 PM  Result Value Ref Range   Creatinine, Urine 27 mg/dL   Total Protein, Urine 28 mg/dL   Protein Creatinine Ratio 1.04 (H) 0.00 - 0.15 mg/mg[Cre]  CBC     Status: Abnormal   Collection Time: 11/27/18  6:49 PM  Result Value Ref Range   WBC 8.9 4.0 - 10.5 K/uL   RBC 3.93 3.87 - 5.11 MIL/uL   Hemoglobin 11.9 (L) 12.0 - 15.0 g/dL   HCT 16.134.5 (L) 09.636.0 - 04.546.0 %   MCV 87.8 80.0 - 100.0 fL   MCH 30.3 26.0 - 34.0 pg   MCHC 34.5  30.0 - 36.0 g/dL   RDW 40.913.2 81.111.5 - 91.415.5 %   Platelets 139 (L) 150 - 400 K/uL   nRBC 0.0 0.0 - 0.2 %  Comprehensive metabolic panel     Status: Abnormal   Collection Time: 11/27/18  6:49 PM  Result Value Ref Range   Sodium 137 135 - 145 mmol/L   Potassium 3.3 (L) 3.5 - 5.1 mmol/L   Chloride 109 98 - 111 mmol/L   CO2 20 (L) 22 - 32 mmol/L   Glucose, Bld 140 (H) 70 - 99 mg/dL   BUN 12 6 - 20 mg/dL   Creatinine, Ser 7.820.56 0.44 - 1.00 mg/dL   Calcium 8.2 (L) 8.9 - 10.3 mg/dL   Total Protein 6.0 (L) 6.5 - 8.1 g/dL   Albumin 2.9 (L) 3.5 - 5.0 g/dL   AST 27 15 - 41 U/L   ALT 19 0 - 44 U/L   Alkaline Phosphatase 90 38 - 126 U/L   Total Bilirubin 0.3 0.3 - 1.2 mg/dL   GFR calc non Af Amer >60 >60 mL/min   GFR calc Af Amer >60 >60 mL/min   Anion gap 8 5 - 15  Sample to Blood Bank     Status: None   Collection  Time: 11/27/18  6:50 PM  Result Value Ref Range   Blood Bank Specimen SAMPLE AVAILABLE FOR TESTING    Sample Expiration      11/30/2018,2359 Performed at [facility], [address] [place], [place] [address]   Type and screen [facility]     Status: None   Collection Time: 11/27/18  6:50 PM  Result Value Ref Range   ABO/RH(D) A POS    Antibody Screen NEG    Sample Expiration      11/30/2018,2359 Performed at [facility], [address] [place], [place] [address]   SARS Coronavirus 2 (CEPHEID - Performed in [facility] hospital lab), Hosp Order     Status: None   Collection Time: 11/27/18  8:14 PM  Result Value Ref Range   SARS Coronavirus 2 NEGATIVE NEGATIVE  CBC     Status: Abnormal   Collection Time: 11/28/18  5:57 AM  Result Value Ref Range   WBC 14.1 (H) 4.0 - 10.5 K/uL   RBC 3.96 3.87 - 5.11 MIL/uL   Hemoglobin 11.9 (L) 12.0 - 15.0 g/dL   HCT 16.5 (L) 79.0 - 38.3 %   MCV 89.1 80.0 - 100.0 fL   MCH 30.1 26.0 - 34.0 pg   MCHC 33.7 30.0 - 36.0 g/dL   RDW 33.8 32.9 - 19.1 %   Platelets 136 (L) 150 - 400 K/uL   nRBC 0.0  0.0 - 0.2 %    Intake/Output Summary (Last 24 hours) at 11/28/2018 1100 Last data filed at 11/28/2018 1018 Gross per 24 hour  Intake 1643.34 ml  Output 1175 ml  Net 468.34 ml      Assessment:   28 y.o. [id number] postoperativeday # 1   Plan:  1) Acute blood loss anemia - hemodynamically stable and asymptomatic - po ferrous sulfate  2) A Positive, Rubella Immune, Varicella Immune  3) TDAP status: given antepartum   4) Formula feeding/Contraception: plans Nexplanon  5) Disposition: continue current care   Parke Poisson, CNM [facility] 11/28/2018, 11:01 AM

## 2018-11-28 NOTE — Plan of Care (Signed)
Vs stable; nausea and emesis at 0000 when pt got to mother/baby unit and continued for about 30 min; 1 phenergan suppository given to pt per MD order; nausea/emesis have improved and by 0315 pt reports being able to take "3 sips of my water"; pt remains on clear liquid diet; foley in place due to dark color of urine; pt declines pain meds; pt moves well in bed for peri care/foley care; baby in SCN

## 2018-11-28 NOTE — Lactation Note (Signed)
This note was copied from a baby's chart. Lactation Consultation Note  Patient Name: Boy Detta Malcom [id number]'[id number] Date: 11/28/2018 Reason for consult: Initial assessment;NICU baby   Maternal Data Has patient been taught Hand Expression?: Yes Does the patient have breastfeeding experience prior to this delivery?: No  Feeding    LATCH Score                   Interventions    Lactation Tools Discussed/Used Pump Review: Setup, frequency, and cleaning;Milk Storage Initiated by:: Leroy Sea) Date initiated:: 11/28/18   Consult Status Consult Status: Follow-up Date: 11/28/18 Follow-up type: In-patient  Pt. Delivered baby overnight and desires to use breast milk and formula for infant feeding choices. LC noticed MOB had not pumped yet so after speaking with her and finding out her desires, LC got MOB a DEBP and cleaning supplies.   LC reviewed pumping and frequency/cleaning. Baby's SCN RN had milk labels ordered and ready.  Burnadette Peter 11/28/2018, 12:10 PM

## 2018-11-29 ENCOUNTER — Other Ambulatory Visit: Payer: BLUE CROSS/BLUE SHIELD

## 2018-11-29 NOTE — Progress Notes (Signed)
POD#2 [id number] Subjective:  Well-appearing, pumping breast milk for baby. Tolerating PO, ambulating and voiding without difficulty. Some increase in pain from yesterday but pain control is adequate with medications.  Objective:  Blood pressure 120/76, pulse 70, temperature 98 F (36.7 C), temperature source Oral, resp. rate 18, height 5\' 5"  (1.651 m), weight 126.6 kg, last menstrual period 04/05/2018, SpO2 99 %, unknown if currently breastfeeding.  General: NAD Pulmonary: no increased work of breathing Abdomen: non-distended, non-tender, fundus firm Incision: Dressing C/D/I Extremities: no edema, no erythema, no tenderness  Results for orders placed or performed during the hospital encounter of 11/27/18 (from the past 72 hour(s))  Urinalysis, Routine w reflex microscopic     Status: Abnormal   Collection Time: 11/27/18  4:28 PM  Result Value Ref Range   Color, Urine STRAW (A) YELLOW   APPearance CLEAR (A) CLEAR   Specific Gravity, Urine 1.005 1.005 - 1.030   pH 6.0 5.0 - 8.0   Glucose, UA NEGATIVE NEGATIVE mg/dL   Hgb urine dipstick NEGATIVE NEGATIVE   Bilirubin Urine NEGATIVE NEGATIVE   Ketones, ur NEGATIVE NEGATIVE mg/dL   Protein, ur 30 (A) NEGATIVE mg/dL   Nitrite NEGATIVE NEGATIVE   Leukocytes,Ua NEGATIVE NEGATIVE   WBC, UA 0-5 0 - 5 WBC/hpf   Bacteria, UA RARE (A) NONE SEEN   Squamous Epithelial / LPF 0-5 0 - 5    Comment: Performed at [facility], [address] [place], [place] [address]  Protein / creatinine ratio, urine     Status: Abnormal   Collection Time: 11/27/18  6:37 PM  Result Value Ref Range   Creatinine, Urine 27 mg/dL   Total Protein, Urine 28 mg/dL    Comment: NO NORMAL RANGE ESTABLISHED FOR THIS TEST   Protein Creatinine Ratio 1.04 (H) 0.00 - 0.15 mg/mg[Cre]    Comment: Performed at [facility], [address] [place], [place] [address]  CBC     Status: Abnormal   Collection Time: 11/27/18  6:49 PM  Result Value Ref Range   WBC  8.9 4.0 - 10.5 K/uL   RBC 3.93 3.87 - 5.11 MIL/uL   Hemoglobin 11.9 (L) 12.0 - 15.0 g/dL   HCT 91.434.5 (L) 78.236.0 - 95.646.0 %   MCV 87.8 80.0 - 100.0 fL   MCH 30.3 26.0 - 34.0 pg   MCHC 34.5 30.0 - 36.0 g/dL   RDW 21.313.2 08.611.5 - 57.815.5 %   Platelets 139 (L) 150 - 400 K/uL   nRBC 0.0 0.0 - 0.2 %    Comment: Performed at [facility], [address] [place], [place] [address]  Comprehensive metabolic panel     Status: Abnormal   Collection Time: 11/27/18  6:49 PM  Result Value Ref Range   Sodium 137 135 - 145 mmol/L   Potassium 3.3 (L) 3.5 - 5.1 mmol/L   Chloride 109 98 - 111 mmol/L   CO2 20 (L) 22 - 32 mmol/L   Glucose, Bld 140 (H) 70 - 99 mg/dL   BUN 12 6 - 20 mg/dL   Creatinine, Ser 9.520.56 0.44 - 1.00 mg/dL   Calcium 8.2 (L) 8.9 - 10.3 mg/dL   Total Protein 6.0 (L) 6.5 - 8.1 g/dL   Albumin 2.9 (L) 3.5 - 5.0 g/dL   AST 27 15 - 41 U/L   ALT 19 0 - 44 U/L   Alkaline Phosphatase 90 38 - 126 U/L   Total Bilirubin 0.3 0.3 - 1.2 mg/dL   GFR calc non  Af Amer >60 >60 mL/min   GFR calc Af Amer >60 >60 mL/min   Anion gap 8 5 - 15    Comment: Performed at [facility], [address] [place], [place] [address]  Sample to Blood Bank     Status: None   Collection Time: 11/27/18  6:50 PM  Result Value Ref Range   Blood Bank Specimen SAMPLE AVAILABLE FOR TESTING    Sample Expiration      11/30/2018,2359 Performed at [facility], [address] [place], [place] [address]   Type and screen [facility]     Status: None   Collection Time: 11/27/18  6:50 PM  Result Value Ref Range   ABO/RH(D) A POS    Antibody Screen NEG    Sample Expiration      11/30/2018,2359 Performed at [facility], [address], [place], [place] [address]   SARS Coronavirus 2 (CEPHEID - Performed in [facility] hospital lab), Hosp Order     Status: None   Collection Time: 11/27/18  8:14 PM  Result Value Ref Range   SARS Coronavirus 2 NEGATIVE NEGATIVE     Comment: (NOTE) If result is NEGATIVE SARS-CoV-2 target nucleic acids are NOT DETECTED. The SARS-CoV-2 RNA is generally detectable in upper and lower  respiratory specimens during the acute phase of infection. The lowest  concentration of SARS-CoV-2 viral copies this assay can detect is 250  copies / mL. A negative result does not preclude SARS-CoV-2 infection  and should not be used as the sole basis for treatment or other  patient management decisions.  A negative result may occur with  improper specimen collection / handling, submission of specimen other  than nasopharyngeal swab, presence of viral mutation(s) within the  areas targeted by this assay, and inadequate number of viral copies  (<250 copies / mL). A negative result must be combined with clinical  observations, patient history, and epidemiological information. If result is POSITIVE SARS-CoV-2 target nucleic acids are DETECTED. The SARS-CoV-2 RNA is generally detectable in upper and lower  respiratory specimens dur ing the acute phase of infection.  Positive  results are indicative of active infection with SARS-CoV-2.  Clinical  correlation with patient history and other diagnostic information is  necessary to determine patient infection status.  Positive results do  not rule out bacterial infection or co-infection with other viruses. If result is PRESUMPTIVE POSTIVE SARS-CoV-2 nucleic acids MAY BE PRESENT.   A presumptive positive result was obtained on the submitted specimen  and confirmed on repeat testing.  While 2019 novel coronavirus  (SARS-CoV-2) nucleic acids may be present in the submitted sample  additional confirmatory testing may be necessary for epidemiological  and / or clinical management purposes  to differentiate between  SARS-CoV-2 and other Sarbecovirus currently known to infect humans.  If clinically indicated additional testing with an alternate test  methodology [phone]) is advised. The SARS-CoV-2  RNA is generally  detectable in upper and lower respiratory sp ecimens during the acute  phase of infection. The expected result is Negative. Fact Sheet for Patients:  BoilerBrush.com.cy Fact Sheet for Healthcare Providers: https://pope.com/ This test is not yet approved or cleared by the [place] FDA and has been authorized for detection and/or diagnosis of SARS-CoV-2 by FDA under an Emergency Use Authorization (EUA).  This EUA will remain in effect (meaning this test can be used) for the duration of the COVID-19 declaration under Section 564(b)(1) of the Act, 21 U.S.C. section  360bbb-3(b)(1), unless the authorization is terminated or revoked sooner. Performed at [facility], [address] [place], [place] [address]   CBC     Status: Abnormal   Collection Time: 11/28/18  5:57 AM  Result Value Ref Range   WBC 14.1 (H) 4.0 - 10.5 K/uL   RBC 3.96 3.87 - 5.11 MIL/uL   Hemoglobin 11.9 (L) 12.0 - 15.0 g/dL   HCT 60.4 (L) 54.0 - 98.1 %   MCV 89.1 80.0 - 100.0 fL   MCH 30.1 26.0 - 34.0 pg   MCHC 33.7 30.0 - 36.0 g/dL   RDW 19.1 47.8 - 29.5 %   Platelets 136 (L) 150 - 400 K/uL   nRBC 0.0 0.0 - 0.2 %    Comment: Performed at [facility], [address], [place], [place] [address]     Assessment:   28 y.o. [phone] postoperative day #2 recovering well.  Plan:  1) Acute blood loss anemia - hemodynamically stable and asymptomatic Continue prenatal vitamins with iron.  2) Blood Type --/--/A POS (06/02 1850) / Rubella Immune  / Varicella Immune  3) TDAP status: received antepartum  4) Breast and formula feeding  5) Contraception: plans Nexplanon  6) Disposition: continue postpartum care. Anticipate discharge tomorrow.  Marcelyn Bruins, CNM 11/29/2018

## 2018-11-29 NOTE — Plan of Care (Signed)
Vs stable; up ad lib; taking motrin and tylenol for pain control; baby in SCN; pt does go to SCN to see baby

## 2018-11-30 DIAGNOSIS — Z98891 History of uterine scar from previous surgery: Secondary | ICD-10-CM | POA: Diagnosis not present

## 2018-11-30 DIAGNOSIS — O1213 Gestational proteinuria, third trimester: Secondary | ICD-10-CM | POA: Diagnosis present

## 2018-11-30 LAB — SURGICAL PATHOLOGY

## 2018-11-30 MED ORDER — ACETAMINOPHEN 500 MG PO TABS: 1000.0000 mg | ORAL_TABLET | Freq: Four times a day (QID) | ORAL | 2 refills | Status: AC | PRN

## 2018-11-30 MED ORDER — OXYCODONE HCL 5 MG PO TABS: 5.0000 mg | ORAL_TABLET | Freq: Four times a day (QID) | ORAL | 0 refills | Status: AC | PRN

## 2018-11-30 MED ORDER — IBUPROFEN 600 MG PO TABS: 600.0000 mg | ORAL_TABLET | Freq: Four times a day (QID) | ORAL | 0 refills | Status: DC | PRN

## 2018-11-30 NOTE — Discharge Instructions (Signed)
Please call your doctor or return to the ER if you experience any chest pains, shortness of breath, dizziness, visual changes, fever greater than 101, any heavy bleeding (saturating more than 1 pad per hour), large clots, or foul smelling discharge, any worsening abdominal pain and cramping that is not controlled by pain medication, any breast concerns (redness/pain), or any signs of postpartum depression. No tampons, enemas, douches, or sexual intercourse for 6 weeks. Also avoid tub baths, hot tubs, or swimming for 6 weeks.    Check your incision daily for any signs of infection such as redness, warmth, swelling, increased pain, or pus/foul smelling drainage   Activity: do not lift over 10 lbs for 6 weeks  No driving for 2 weeks  Pelvic rest for 6 weeks

## 2018-11-30 NOTE — Plan of Care (Signed)
Vs stable; up ad lib; taking motrin and tylenol for pain control; goes to SCN to see baby; tolerating regular diet; probable discharge today

## 2018-11-30 NOTE — Progress Notes (Signed)
Discharge order received from doctor. On-Q pump removed at discharge per patient request. Incision cleaning kit given. Reviewed discharge instructions and prescriptions with patient and answered all questions. Follow up appointment given. Patient verbalized understanding. Patient discharged home (without infant; infant in SCN) via wheelchair by nursing/auxillary.     Hilbert Bible, RN

## 2018-11-30 NOTE — Lactation Note (Signed)
Lactation Consultation Note  Patient Name: Tiffany Carpenter NUUVO'[id number] Date: 11/30/2018  MOm does not have a breast pump for home use and is being discharge without her baby in SCN, Faxed referral to [facility] and called them, they returned call at 1600 and stated that mom would have to pick up a pump on Monday.  I loaned mom a Symphony breast pump to be returned on MOnday after she picks up [facility] pump, I encouraged her to pump breasts every 3hrs or at least 8 times in 24 hrs and may use symphony pump in SCN when she is here to visit baby     Maternal Data    Feeding    LATCH Score                   Interventions    Lactation Tools Discussed/Used     Consult Status      Tiffany Carpenter 11/30/2018, 9:30 PM

## 2018-12-03 ENCOUNTER — Ambulatory Visit: Payer: Self-pay

## 2018-12-03 NOTE — Lactation Note (Signed)
This note was copied from a baby's chart. Lactation Consultation Note  Patient Name: Tiffany Carpenter PJPET'K Date: 12/03/2018   Mom returned loaner Symphony pump today.  Mom lives in [place] and has [place] so got DEBP through [facility].  Maternal Data    Feeding    LATCH Score                   Interventions    Lactation Tools Discussed/Used     Consult Status      Tiffany Carpenter 12/03/2018, 10:12 PM

## 2018-12-09 ENCOUNTER — Ambulatory Visit: Payer: Self-pay

## 2018-12-09 NOTE — Lactation Note (Signed)
This note was copied from a baby's chart. Lactation Consultation Note  Patient Name: Tiffany Carpenter [id number] Date: 12/09/2018   Assisted mom in comfortable position with pillow support and nursing stool with [name] in modified cross cradle hold skin to skin.  Mom pumped 2 hours before coming in to breast feed, so mom's breasts were full.  We could easily hand express lots of breast milk.  [name] was sucking on hands and rooting towards breast with wide open mouth and flanged lips.  Mom has flat nipple.  Tried reverse pressure and breast compression.  Hand expressed to soften areola/nipple to achieve latch, but could not sustain latch.  Applied #20 nipple shield.  Latched to left breast with nipple shield after a couple of attempts and began strong rhythmic sucking with swallows.  Breast fed for 11 minutes with assistance of breast massage and gentle stimulation to keep him nutritively sucking at the breast.  After 11 minutes, he started stirring and then came off the breast crying.  Shortly after coming off the breast, he had large stool.  Attempted to get him to latch to left breast without success.  Tube feeding started and he fell asleep.  Mom attempted to breast feed with last baby.  Mom reports pumping every 2 hours when home and only gets an ounce total at a pumping session except in am and gets 2 ounces at that session usually.  Mom reports never could even get first baby to latch.  Praised mom for her commitment to continue to pump every 2 hours at home and put him skin to skin and breast feed when visiting.  Mom only stays an hour or 2 when visits so does not have a pump set up here to use at bedside.  Encouraged mom to call with any questions, concerns or when assistance needed.    Maternal Data    Feeding Feeding Type: Breast Milk with Formula added  LATCH Score                   Interventions    Lactation Tools Discussed/Used Tools: 4F feeding tube / Syringe;Pump   Consult  Status      Jarold Motto 12/09/2018, 2:25 PM

## 2018-12-10 ENCOUNTER — Other Ambulatory Visit: Payer: Self-pay

## 2018-12-10 ENCOUNTER — Ambulatory Visit (INDEPENDENT_AMBULATORY_CARE_PROVIDER_SITE_OTHER): Payer: BLUE CROSS/BLUE SHIELD | Admitting: Obstetrics & Gynecology

## 2018-12-10 ENCOUNTER — Ambulatory Visit: Payer: Self-pay

## 2018-12-10 ENCOUNTER — Encounter: Payer: Self-pay | Admitting: Obstetrics & Gynecology

## 2018-12-10 NOTE — Lactation Note (Signed)
This note was copied from a baby's chart. Lactation Consultation Note  Patient Name: Tiffany Carpenter [id number] Date: 12/10/2018   Called to assist with breast feeding.  Mom positioned for comfort with pillow support and nursing stool.  [name] put skin to skin with mom.  Mom wanting to try without nipple shield.  He would latch, take a few sucks and then come off rooting again.  Once #20 nipple shield was applied, he was able to sustain latch for 10 minute feeding.  At 10 minutes, he started fussing and pushing away from the breast, even though he would open his mouth as if he was going to latch again.  We never could get him to maintain latch but for a couple of sucks.  Left him skin to skin near the breast and tube feeding initiated.  Shortly into starting of tube feeding, he fell asleep.  He got entire tube feeding after breast feeding and spit curdled milk an hour after start of breast feed.  Mom is pumping at home using [facility] loaner Symphony pump consistently pumping and bringing expressed milk to SCN when she visits for around an hour every day.  Mom had a 1:30 pm OB appointment and said she might come back for 3 pm feeding, but never made it back.  Encouraged mom to continue to put him to the breast whenever she visits.  Maternal Data    Feeding Feeding Type: Breast Milk Nipple Type: Slow - flow  LATCH Score                   Interventions    Lactation Tools Discussed/Used Tools: Bottle   Consult Status      Jarold Motto 12/10/2018, 9:55 PM

## 2018-12-10 NOTE — Progress Notes (Signed)
  Postoperative Follow-up Patient presents post op from CS for non-reassuring fetal heart rate, IUGR, 2 weeks ago.  Subjective: Patient reports marked improvement in her preop symptoms. Eating a regular diet without difficulty. The patient is not having any pain.  Activity: sedentary. Patient reports additional symptom's since surgery of min vag bleeding.  Infant in SCN feeding and growing (birth weight 4 lbs).    Objective: BP 122/80   Ht 5\' 6"  (1.676 m)   Wt 267 lb (121.1 kg)   BMI 43.09 kg/m  Physical Exam Constitutional:      General: She is not in acute distress.    Appearance: She is well-developed.  Cardiovascular:     Rate and Rhythm: Normal rate.  Pulmonary:     Effort: Pulmonary effort is normal.  Abdominal:     General: There is no distension.     Palpations: Abdomen is soft.     Tenderness: There is no abdominal tenderness.     Comments: Incision He[facility]   Musculoskeletal: Normal range of motion.  Neurological:     Mental Status: She is alert and oriented to person, place, and time.     Cranial Nerves: No cranial nerve deficit.  Skin:    General: Skin is warm and dry.     Assessment: s/p :  cesarean section stable  Plan: Patient has done well after surgery with no apparent complications.  I have discussed the post-operative course to date, and the expected progress moving forward.  The patient understands what complications to be concerned about.  I will see the patient in routine follow up, or sooner if needed.    Activity plan: No heavy lifting.  Pelvic rest.  Plans Nexplanon at 6 weeks visit (plans at [facility]) No s/sx PPD  Hoyt Koch 12/10/2018, 2:07 PM

## 2018-12-16 ENCOUNTER — Ambulatory Visit: Payer: Self-pay

## 2018-12-16 NOTE — Lactation Note (Signed)
This note was copied from a baby's chart. Lactation Consultation Note  Patient Name: Tiffany Carpenter [id number] Date: 12/16/2018   Mom only brought in enough breast milk for 3 to 4 feedings today.  He is getting more and more formula and less breast milk.  Mom is still using Symphony pump she got from [facility].  [name] had been breast feeding fairly well last weekend.  Mom declines putting [name] to the breast today stating she just wanted to bottle feed this feeding.  He has been getting fed po ad lib 45 to 60 ml. Noted [name] had been congested and tachypneic Saturday.  After Xray it was determined he had some infiltrates.  He seems to be doing better after starting on Lasix today.  Reviewed with mom again supply and demand and need for breast massage, hand expression and pumping 8 or more times in 24 hours to ensure a more plentiful milk supply.  Encouraged more skin to skin and breast feeding when here to visit.  Encouraged to call lactation with questions, concerns or assistance.   Maternal Data    Feeding Feeding Type: Bottle Fed - Formula Nipple Type: Slow - flow  LATCH Score                   Interventions    Lactation Tools Discussed/Used     Consult Status      Jarold Motto 12/16/2018, 11:08 PM

## 2019-01-05 ENCOUNTER — Encounter: Payer: Self-pay | Admitting: Family Medicine

## 2019-01-08 ENCOUNTER — Ambulatory Visit: Payer: Medicaid Other | Admitting: Nurse Practitioner

## 2019-01-08 ENCOUNTER — Encounter: Payer: Self-pay | Admitting: Nurse Practitioner

## 2019-01-08 ENCOUNTER — Other Ambulatory Visit: Payer: Self-pay

## 2019-01-08 DIAGNOSIS — O99013 Anemia complicating pregnancy, third trimester: Secondary | ICD-10-CM

## 2019-01-08 DIAGNOSIS — Z30017 Encounter for initial prescription of implantable subdermal contraceptive: Secondary | ICD-10-CM | POA: Diagnosis not present

## 2019-01-08 LAB — HEMOGLOBIN, FINGERSTICK: Hemoglobin: 12.7 g/dL (ref 11.1–15.9)

## 2019-01-08 MED ORDER — ETONOGESTREL 68 MG ~~LOC~~ IMPL
68.0000 mg | DRUG_IMPLANT | Freq: Once | SUBCUTANEOUS | Status: AC
  Administered 2019-01-08: 13:00:00 68 mg via SUBCUTANEOUS

## 2019-01-08 NOTE — Progress Notes (Signed)
Patient here today for PP appt and Nexplanon insertion. Continues to take a PNV QD. Nexplanon consult complete Hal Morales, RN

## 2019-01-08 NOTE — Patient Instructions (Signed)
Etonogestrel implant What is this medicine? ETONOGESTREL (et oh noe JES trel) is a contraceptive (birth control) device. It is used to prevent pregnancy. It can be used for up to 3 years. This medicine may be used for other purposes; ask your health care provider or pharmacist if you have questions. COMMON BRAND NAME(S): Implanon, Nexplanon What should I tell my health care provider before I take this medicine? They need to know if you have any of these conditions:  abnormal vaginal bleeding  blood vessel disease or blood clots  breast, cervical, endometrial, ovarian, liver, or uterine cancer  diabetes  gallbladder disease  heart disease or recent heart attack  high blood pressure  high cholesterol or triglycerides  kidney disease  liver disease  migraine headaches  seizures  stroke  tobacco smoker  an unusual or allergic reaction to etonogestrel, anesthetics or antiseptics, other medicines, foods, dyes, or preservatives  pregnant or trying to get pregnant  breast-feeding How should I use this medicine? This device is inserted just under the skin on the inner side of your upper arm by a health care professional. Talk to your pediatrician regarding the use of this medicine in children. Special care may be needed. Overdosage: If you think you have taken too much of this medicine contact a poison control center or emergency room at once. NOTE: This medicine is only for you. Do not share this medicine with others. What if I miss a dose? This does not apply. What may interact with this medicine? Do not take this medicine with any of the following medications:  amprenavir  fosamprenavir This medicine may also interact with the following medications:  acitretin  aprepitant  armodafinil  bexarotene  bosentan  carbamazepine  certain medicines for fungal infections like fluconazole, ketoconazole, itraconazole and voriconazole  certain medicines to treat  hepatitis, HIV or AIDS  cyclosporine  felbamate  griseofulvin  lamotrigine  modafinil  oxcarbazepine  phenobarbital  phenytoin  primidone  rifabutin  rifampin  rifapentine  St. John's wort  topiramate This list may not describe all possible interactions. Give your health care provider a list of all the medicines, herbs, non-prescription drugs, or dietary supplements you use. Also tell them if you smoke, drink alcohol, or use illegal drugs. Some items may interact with your medicine. What should I watch for while using this medicine? This product does not protect you against HIV infection (AIDS) or other sexually transmitted diseases. You should be able to feel the implant by pressing your fingertips over the skin where it was inserted. Contact your doctor if you cannot feel the implant, and use a non-hormonal birth control method (such as condoms) until your doctor confirms that the implant is in place. Contact your doctor if you think that the implant may have broken or become bent while in your arm. You will receive a user card from your health care provider after the implant is inserted. The card is a record of the location of the implant in your upper arm and when it should be removed. Keep this card with your health records. What side effects may I notice from receiving this medicine? Side effects that you should report to your doctor or health care professional as soon as possible:  allergic reactions like skin rash, itching or hives, swelling of the face, lips, or tongue  breast lumps, breast tissue changes, or discharge  breathing problems  changes in emotions or moods  if you feel that the implant may have broken or   bent while in your arm  high blood pressure  pain, irritation, swelling, or bruising at the insertion site  scar at site of insertion  signs of infection at the insertion site such as fever, and skin redness, pain or discharge  signs and  symptoms of a blood clot such as breathing problems; changes in vision; chest pain; severe, sudden headache; pain, swelling, warmth in the leg; trouble speaking; sudden numbness or weakness of the face, arm or leg  signs and symptoms of liver injury like dark yellow or brown urine; general ill feeling or flu-like symptoms; light-colored stools; loss of appetite; nausea; right upper belly pain; unusually weak or tired; yellowing of the eyes or skin  unusual vaginal bleeding, discharge Side effects that usually do not require medical attention (report to your doctor or health care professional if they continue or are bothersome):  acne  breast pain or tenderness  headache  irregular menstrual bleeding  nausea This list may not describe all possible side effects. Call your doctor for medical advice about side effects. You may report side effects to FDA at 1-800-FDA-1088. Where should I keep my medicine? This drug is given in a hospital or clinic and will not be stored at home. NOTE: This sheet is a summary. It may not cover all possible information. If you have questions about this medicine, talk to your doctor, pharmacist, or health care provider.  2020 Elsevier/Gold Standard (2017-05-02 14:11:42)  

## 2019-01-14 NOTE — Progress Notes (Signed)
Family Planning Visit- Repeat Yearly Visit  Subjective:  Calandra Madura is a 28 y.o. being seen today for an well woman visit and to discuss family planning options.    She is currently using none and desires Nexplanon for [facility] for pregnancy prevention. Patient reports she does not if she or her partner wants a pregnancy in the next year. Patient has the following medical conditionshas Class 3 severe obesity due to excess calories without serious comorbidity with body mass index (BMI) of 40.0 to 44.9 in adult [facility]); History of anemia; Supervision of high risk pregnancy, antepartum; Low birth weight infant; Obesity complicating pregnancy; Proteinuria affecting pregnancy in third trimester; Delivery of pregnancy by cesarean section; and Postpartum care following cesarean delivery on their problem list.  Chief Complaint  Patient presents with  . Gynecologic Exam    Postpartum, Nexplanon placement     Patient reports - no sexual activity since birth of baby  Patient denies any complaints at this time  Does the patient desire a pregnancy in the next year? ([place] flowsheet)  See flowsheet for other program required questions.   Body mass index is 43.55 kg/m. - Patient is eligible for diabetes screening based on BMI and age >73?  Post partum HA1C ordered? no  Patient reports 1 of partners in last year. Desires STI screening?  No - postpartum  Does the patient have a current or past history of drug use? No   No components found for: HCV]   Health Maintenance Due  Topic Date Due  . TETANUS/TDAP  05/03/2011    Review of Systems  All other systems reviewed and are negative.   The following portions of the patient's history were reviewed and updated as appropriate: allergies, current medications, past family history, past medical history, past social history, past surgical history and problem list. Problem list updated.  Objective:   Vitals:   01/08/19 0957  BP: 98/64  Temp: 97.9 F  (36.6 C)  TempSrc: Oral  Weight: 269 lb 12.8 oz (122.4 kg)    Physical Exam Constitutional:      Appearance: Normal appearance.  Neck:     Musculoskeletal: Full passive range of motion without pain, normal range of motion and neck supple.     Thyroid: No thyroid mass.     Trachea: Trachea normal.  Cardiovascular:     Rate and Rhythm: Normal rate and regular rhythm.     Heart sounds: Normal heart sounds.  Pulmonary:     Effort: Pulmonary effort is normal.     Breath sounds: Normal breath sounds and air entry.  Musculoskeletal:     Right lower leg: No edema.     Left lower leg: No edema.  Lymphadenopathy:     Cervical: No cervical adenopathy.     Right cervical: No superficial or posterior cervical adenopathy.    Left cervical: No superficial or posterior cervical adenopathy.  Skin:    General: Skin is warm and dry.  Neurological:     Mental Status: She is alert.  Psychiatric:        Attention and Perception: Attention normal.        Mood and Affect: Mood normal.        Speech: Speech normal.        Behavior: Behavior is cooperative.        Cognition and Memory: Cognition normal.       Assessment and Plan:  Ople Girgis is a 28 y.o. female presenting to the [facility] for an initial well woman/postpartum exam/family planning visit  Contraception counseling: Reviewed all forms of birth control options available including abstinence; over the counter/barrier methods; hormonal contraceptive medication including pill, patch, ring, injection,contraceptive implant; hormonal and nonhormonal IUDs; permanent sterilization options including vasectomy and the various tubal sterilization modalities. Risks and benefits reviewed.  Questions were answered.  Written information was also given to the patient to review.  Patient desires Nexplanon, this was prescribed for patient. She will follow up in 1 year and prn for surveillance.  She was told to call with any  further questions, or with any concerns about this method of contraception.  Emphasized use of condoms 100% of the time for STI prevention.  1. Postpartum care and examination Client doing well postpartum [facility] - denies any current sexual activity  2. Implanon insertion Nexplanon Insertion Procedure Patient identified, informed consent performed, consent signed.   Patient does understand that irregular bleeding is a very common side effect of this medication. She was advised to have backup contraception after placement. Patient was determined to meet WHO criteria for not being pregnant. Appropriate time out taken.  The insertion site was identified 8-10 cm (3-4 inches) from the medial epicondyle of the humerus and 3-5 cm (1.25-2 inches) posterior to (below) the sulcus (groove) between the biceps and triceps muscles of the patient's left arm and marked.  Patient was prepped with alcohol swab and then injected with 3 ml of 1% lidocaine.  Arm was prepped with chlorhexidene, Nexplanon removed from packaging,  Device confirmed in needle, then inserted full length of needle and withdrawn per handbook instructions. Nexplanon was able to palpated in the patient's arm; patient palpated the insert herself. There was minimal blood loss.  Patient insertion site covered with guaze and a pressure bandage to reduce any bruising.  The patient tolerated the procedure well and was given post procedure instructions.  - etonogestrel (NEXPLANON) implant 68 mg  3. Anemia during pregnancy in third trimester Await results - Hemoglobin, venipuncture - Hemoglobin     No follow-ups on file.  No future appointments.  Donn PieriniKarla W , NP

## 2019-02-04 ENCOUNTER — Telehealth: Payer: Self-pay | Admitting: Family Medicine

## 2019-02-04 NOTE — Telephone Encounter (Signed)
Patient needs her paperwork so she can go back to work that was given to the nurse back at her last visit.

## 2019-02-05 NOTE — Telephone Encounter (Signed)
Phone call returned to patient. Patient requesting to pick up FMLA paperwork completed 11/2018. RN explained that patient had received per documentation a phone call informing that FMLA paperwork had been placed at info booth for patient to pick up 12/14/2018. Patient states "I never received a phone call." RN checked for same paperwork at info booth. This same paperwork was not located. RN explained that a copy can be copied from chart and left at info booth to be picked up today. Patient states she will come tomorrow 02/06/19 to pick up paperwork. Paperwork as above left at info booth for patient to pick up.  Hal Morales, RN 4

## 2019-02-06 ENCOUNTER — Encounter: Payer: Self-pay | Admitting: Nurse Practitioner

## 2019-02-06 ENCOUNTER — Telehealth: Payer: Self-pay | Admitting: Family Medicine

## 2019-02-06 NOTE — Telephone Encounter (Signed)
Patient picked up her forms that were in the front desk today but says those arent the ones she needed. Patient states those were for her to be out of work, but now needs the forms to return to work which she handed to nurse on the [place] visit she had.

## 2019-02-06 NOTE — Telephone Encounter (Signed)
TC to patient to let her know we have been unable to locate the forms she left for provider to complete. Patient asked if she would be willing to drop off a new set of forms, or if her job would accept a letter (on [employer] letterhead) stating she can go back to work. Patient states she will contact her HR department to find out and will bring new forms if needed. Patient told to drop them off in maternity clinic. Patient agrees to plan.Jenetta Downer, RN

## 2019-02-07 ENCOUNTER — Telehealth: Payer: Self-pay

## 2019-02-07 NOTE — Telephone Encounter (Signed)
Call to client regarding return to work note-desires to have letter faxed to [phone]; letter faxed and note mailed to client.

## 2019-03-13 NOTE — Addendum Note (Signed)
Addended by: Cletis Media on: 03/13/2019 04:06 PM   Modules accepted: Orders

## 2019-10-05 ENCOUNTER — Ambulatory Visit: Payer: Medicaid Other | Attending: Internal Medicine

## 2019-10-05 DIAGNOSIS — Z23 Encounter for immunization: Secondary | ICD-10-CM

## 2019-10-05 NOTE — Progress Notes (Signed)
   Covid-19 Vaccination Clinic  Name:  Tiffany Carpenter    MRN: [id number] DOB: 24-Jun-1991  10/05/2019  [name] was observed post Covid-19 immunization for 15 minutes without incident. She was provided with Vaccine Information Sheet and instruction to access the V-Safe system.   [name] was instructed to call 911 with any severe reactions post vaccine: Marland Kitchen Difficulty breathing  . Swelling of face and throat  . A fast heartbeat  . A bad rash all over body  . Dizziness and weakness   Immunizations Administered    Name Date Dose VIS Date Route   Pfizer COVID-19 Vaccine 10/05/2019  4:42 PM 0.3 mL 06/07/2019 Intramuscular   Manufacturer: [employer], [employer]   Lot: [id number]   NDC: [address]-4

## 2019-10-29 ENCOUNTER — Ambulatory Visit: Payer: Medicaid Other | Attending: Internal Medicine

## 2019-10-29 DIAGNOSIS — Z23 Encounter for immunization: Secondary | ICD-10-CM

## 2019-10-29 NOTE — Progress Notes (Signed)
   Covid-19 Vaccination Clinic  Name:  Tiffany Carpenter    MRN: [id number] DOB: 02-07-1991  10/29/2019  Tiffany Carpenter was observed post Covid-19 immunization for 15 minutes without incident. She was provided with Vaccine Information Sheet and instruction to access the V-Safe system.   Tiffany Carpenter was instructed to call 911 with any severe reactions post vaccine: Marland Kitchen Difficulty breathing  . Swelling of face and throat  . A fast heartbeat  . A bad rash all over body  . Dizziness and weakness   Immunizations Administered    Name Date Dose VIS Date Route   Pfizer COVID-19 Vaccine 10/29/2019  4:56 PM 0.3 mL 08/21/2018 Intramuscular   Manufacturer: [employer], [employer]   Lot: [id number]   NDC: [address]-3

## 2020-09-30 NOTE — Patient Instructions (Signed)
I value your feedback and you entrusting us with your care. If you get a [place] patient survey, I would appreciate you taking the time to let us know about your experience today. Thank you! ? ? ?

## 2020-09-30 NOTE — Progress Notes (Signed)
Tiffany Custard, FNP   Chief Complaint  Patient presents with  . Vaginal Itching    Due to bumps in vag area x 1 week    HPI:      Tiffany Carpenter is a 30 y.o. [id number] whose LMP was No LMP recorded. Patient has had an implant., presents today for vaginal itching RT labia minora for the past wk. Has noted hard bumbs that don't pop. Tried vagisil without relief. No increased vag d/c, odor. Uses dove cucumber soap and dryer sheets. Doesn't shave.  She is sex active, no new partners, has nexplanon. Pap due 9/22.   Past Medical History:  Diagnosis Date  . No pertinent past medical history     Past Surgical History:  Procedure Laterality Date  . CESAREAN SECTION N/A 11/27/2018   Procedure: CESAREAN SECTION;  Surgeon: Nadara Mustard, MD;  Location: [facility] ORS;  Service: Obstetrics;  Laterality: N/A;    History reviewed. No pertinent family history.  Social History   Socioeconomic History  . Marital status: Married    Spouse name: Daphine Deutscher  . Number of children: 1  . Years of education: Not on file  . Highest education level: GED or equivalent  Occupational History  . Occupation: Social research officer, government  Tobacco Use  . Smoking status: Never Smoker  . Smokeless tobacco: Never Used  Vaping Use  . Vaping Use: Never used  Substance and Sexual Activity  . Alcohol use: No    Alcohol/week: 0.0 standard drinks  . Drug use: No  . Sexual activity: Yes    Partners: Male    Birth control/protection: Implant  Other Topics Concern  . Not on file  Social History Narrative  . Not on file   Social Determinants of Health   Financial Resource Strain: Not on file  Food Insecurity: Not on file  Transportation Needs: Not on file  Physical Activity: Not on file  Stress: Not on file  Social Connections: Not on file  Intimate Partner Violence: Not on file    Outpatient Medications Prior to Visit  Medication Sig Dispense Refill  . etonogestrel (NEXPLANON) 68 MG IMPL implant 1 each by  Subdermal route once. Approximate insertion at [facility] 11/2018    . ibuprofen (ADVIL) 600 MG tablet Take 1 tablet (600 mg total) by mouth every 6 (six) hours as needed for headache, mild pain, moderate pain or cramping. (Patient not taking: Reported on 01/08/2019) 60 tablet 0  . Prenatal Vit-Fe Fumarate-FA (PRENATAL VITAMIN PO) Take 1 tablet by mouth daily.     No facility-administered medications prior to visit.      ROS:  Review of Systems  Constitutional: Negative for fever.  Gastrointestinal: Negative for blood in stool, constipation, diarrhea, nausea and vomiting.  Genitourinary: Negative for dyspareunia, dysuria, flank pain, frequency, hematuria, urgency, vaginal bleeding, vaginal discharge and vaginal pain.  Musculoskeletal: Negative for back pain.  Skin: Negative for rash.    OBJECTIVE:   Vitals:  BP 110/80   Ht 5\' 6"  (1.676 m)   Wt 250 lb (113.4 kg)   Breastfeeding No   BMI 40.35 kg/m   Physical Exam Vitals reviewed.  Constitutional:      Appearance: She is well-developed.  Pulmonary:     Effort: Pulmonary effort is normal.  Genitourinary:    General: Normal vulva.     Pubic Area: No rash.      Labia:        Right: No rash, tenderness or lesion.  Left: No rash, tenderness or lesion.      Vagina: Normal. No vaginal discharge, erythema or tenderness.     Cervix: Normal.     Uterus: Normal. Not enlarged and not tender.      Adnexa: Right adnexa normal and left adnexa normal.       Right: No mass or tenderness.         Left: No mass or tenderness.         Comments: VAG ITCH RT LABIA MINORA; NO LESIONS; "BUMPS" ARE NORMAL GLANDS Musculoskeletal:        General: Normal range of motion.     Cervical back: Normal range of motion.  Skin:    General: Skin is warm and dry.  Neurological:     General: No focal deficit present.     Mental Status: She is alert and oriented to person, place, and time.  Psychiatric:        Mood and Affect: Mood normal.         Behavior: Behavior normal.        Thought Content: Thought content normal.        Judgment: Judgment normal.     Results: Results for orders placed or performed in visit on 10/01/20 (from the past 24 hour(s))  POCT Wet Prep with KOH     Status: Normal   Collection Time: 10/01/20  9:28 AM  Result Value Ref Range   Trichomonas, UA Negative    Clue Cells Wet Prep HPF POC neg    Epithelial Wet Prep HPF POC     Yeast Wet Prep HPF POC neg    Bacteria Wet Prep HPF POC     RBC Wet Prep HPF POC     WBC Wet Prep HPF POC     KOH Prep POC Negative Negative     Assessment/Plan: Acute vaginitis - Plan: clotrimazole-betamethasone (LOTRISONE) cream, POCT Wet Prep with KOH; pos ext sx. Neg wet prep. Rx lotrisone crm. Question chem vs fungal. Change to dove sens skin soap, line dry underwear. F/u prn.    Meds ordered this encounter  Medications  . clotrimazole-betamethasone (LOTRISONE) cream    Sig: Apply externally BID prn sx up to 2 wks    Dispense:  15 g    Refill:  0    Order Specific Question:   Supervising Provider    Answer:   Nadara Mustard [[id number]]      Return in about 5 months (around 03/03/2021), or if symptoms worsen or fail to improve, for annual.  Cybill Uriegas B. Marguerite Jarboe, PA-C 10/01/2020 9:30 AM

## 2020-10-01 ENCOUNTER — Other Ambulatory Visit: Payer: Self-pay

## 2020-10-01 ENCOUNTER — Encounter: Payer: Self-pay | Admitting: Obstetrics and Gynecology

## 2020-10-01 ENCOUNTER — Ambulatory Visit (INDEPENDENT_AMBULATORY_CARE_PROVIDER_SITE_OTHER): Payer: BC Managed Care – PPO | Admitting: Obstetrics and Gynecology

## 2020-10-01 VITALS — BP 110/80 | Ht 66.0 in | Wt 250.0 lb

## 2020-10-01 DIAGNOSIS — N760 Acute vaginitis: Secondary | ICD-10-CM | POA: Diagnosis not present

## 2020-10-01 LAB — POCT WET PREP WITH KOH
Clue Cells Wet Prep HPF POC: NEGATIVE
KOH Prep POC: NEGATIVE
Trichomonas, UA: NEGATIVE
Yeast Wet Prep HPF POC: NEGATIVE

## 2020-10-01 MED ORDER — CLOTRIMAZOLE-BETAMETHASONE 1-0.05 % EX CREA: TOPICAL_CREAM | CUTANEOUS | 0 refills | Status: AC

## 2024-02-08 ENCOUNTER — Ambulatory Visit: Admitting: Podiatry

## 2024-02-15 ENCOUNTER — Ambulatory Visit: Admitting: Podiatry

## 2024-02-15 ENCOUNTER — Encounter: Payer: Self-pay | Admitting: Podiatry

## 2024-02-15 VITALS — Ht 66.0 in | Wt 250.0 lb

## 2024-02-15 DIAGNOSIS — M722 Plantar fascial fibromatosis: Secondary | ICD-10-CM

## 2024-02-15 DIAGNOSIS — Q666 Other congenital valgus deformities of feet: Secondary | ICD-10-CM | POA: Diagnosis not present

## 2024-02-15 NOTE — Progress Notes (Signed)
  Subjective:  Patient ID: Tiffany Carpenter, female    DOB: 01-13-1991,  MRN: [id number]  Chief Complaint  Patient presents with   Foot Pain    Pt is here due to right heel pain that started a few months ago, states she recently changed here shoes and pain has gotten a little better.    33 y.o. female presents with the above complaint.  Patient presents with right plantar fasciitis pain painful to touch has progressed gotten worse worse with ambulation worse with pressure she wanted to discuss treatment options for this pain scale 7 out of 10 dull aching nature.  She states she has a history of plantar fasciitis in the past.  Is gotten a little bit better but still hurts with ambulation she wears more flat shoes.   Review of Systems: Negative except as noted in the HPI. Denies N/V/F/Ch.  Past Medical History:  Diagnosis Date   No pertinent past medical history     Current Outpatient Medications:    clotrimazole -betamethasone  (LOTRISONE ) cream, Apply externally BID prn sx up to 2 wks, Disp: 15 g, Rfl: 0   etonogestrel  (NEXPLANON ) 68 MG IMPL implant, 1 each by Subdermal route once. Approximate insertion at [facility] 11/2018, Disp: , Rfl:   Social History   Tobacco Use  Smoking Status Never  Smokeless Tobacco Never    No Known Allergies Objective:  There were no vitals filed for this visit. Body mass index is 40.35 kg/m. Constitutional Well developed. Well nourished.  Vascular Dorsalis pedis pulses palpable bilaterally. Posterior tibial pulses palpable bilaterally. Capillary refill normal to all digits.  No cyanosis or clubbing noted. Pedal hair growth normal.  Neurologic Normal speech. Oriented to person, place, and time. Epicritic sensation to light touch grossly present bilaterally.  Dermatologic Nails well groomed and normal in appearance. No open wounds. No skin lesions.  Orthopedic: Normal joint ROM without pain or crepitus bilaterally. No visible deformities. Tender to  palpation at the calcaneal tuber right. No pain with calcaneal squeeze right. Ankle ROM diminished range of motion right. Silfverskiold Test: positive right.   Radiographs: None  Assessment:   1. Plantar fasciitis of right foot   2. Pes planovalgus    Plan:  Patient was evaluated and treated and all questions answered.  Plantar Fasciitis, right - XR reviewed as above.  - Educated on icing and stretching. Instructions given.  - Injection delivered to the plantar fascia as below. - DME: Plantar fascial brace dispensed to support the medial longitudinal arch of the foot and offload pressure from the heel and prevent arch collapse during weightbearing - Pharmacologic management: None  Pes planovalgus -I explained to patient the etiology of pes planovalgus and relationship with Planter fasciitis and various treatment options were discussed.  Given patient foot structure in the setting of Planter fasciitis I believe patient will benefit from custom-made orthotics to help control the hindfoot motion support the arch of the foot and take the stress away from plantar fascial.  Patient agrees with the plan like to proceed with orthotics -Patient was casted for orthotics   Procedure: Injection Tendon/Ligament Location: Right plantar fascia at the glabrous junction; medial approach. Skin Prep: alcohol Injectate: 0.5 cc 0.5% marcaine  plain, 0.5 cc of 1% Lidocaine , 0.5 cc kenalog  10. Disposition: Patient tolerated procedure well. Injection site dressed with a band-aid.  No follow-ups on file.

## 2024-03-14 ENCOUNTER — Ambulatory Visit: Admitting: Podiatry

## 2024-03-21 ENCOUNTER — Ambulatory Visit: Admitting: Podiatry

## 2024-03-21 ENCOUNTER — Encounter: Payer: Self-pay | Admitting: Podiatry

## 2024-03-21 DIAGNOSIS — M722 Plantar fascial fibromatosis: Secondary | ICD-10-CM

## 2024-03-21 DIAGNOSIS — M62461 Contracture of muscle, right lower leg: Secondary | ICD-10-CM

## 2024-03-21 NOTE — Progress Notes (Signed)
  Subjective:  Patient ID: Tiffany Carpenter, female    DOB: 11/07/1990,  MRN: [id number]  Chief Complaint  Patient presents with   Plantar Fasciitis    33 y.o. female presents with the above complaint.  Patient presents with right plantar fasciitis follow-up.  She she states she is doing a lot better the injection helped considerably.  She states her pain has decreased considerably.  She would like to do another injection.   Review of Systems: Negative except as noted in the HPI. Denies N/V/F/Ch.  Past Medical History:  Diagnosis Date   No pertinent past medical history     Current Outpatient Medications:    clotrimazole -betamethasone  (LOTRISONE ) cream, Apply externally BID prn sx up to 2 wks, Disp: 15 g, Rfl: 0   etonogestrel  (NEXPLANON ) 68 MG IMPL implant, 1 each by Subdermal route once. Approximate insertion at [facility] 11/2018, Disp: , Rfl:   Social History   Tobacco Use  Smoking Status Never  Smokeless Tobacco Never    No Known Allergies Objective:  There were no vitals filed for this visit. There is no height or weight on file to calculate BMI. Constitutional Well developed. Well nourished.  Vascular Dorsalis pedis pulses palpable bilaterally. Posterior tibial pulses palpable bilaterally. Capillary refill normal to all digits.  No cyanosis or clubbing noted. Pedal hair growth normal.  Neurologic Normal speech. Oriented to person, place, and time. Epicritic sensation to light touch grossly present bilaterally.  Dermatologic Nails well groomed and normal in appearance. No open wounds. No skin lesions.  Orthopedic: Normal joint ROM without pain or crepitus bilaterally. No visible deformities. Tender to palpation at the calcaneal tuber right. No pain with calcaneal squeeze right. Ankle ROM diminished range of motion right. Silfverskiold Test: positive right.   Radiographs: None  Assessment:   No diagnosis found.  Plan:  Patient was evaluated and treated and  all questions answered.  Plantar Fasciitis, right with underlying gastrocnemius equinus - XR reviewed as above.  - Educated on icing and stretching. Instructions given.  - second Injection delivered to the plantar fascia as below. - DME: Plantar fascial brace dispensed to support the medial longitudinal arch of the foot and offload pressure from the heel and prevent arch collapse during weightbearing - Pharmacologic management: None  Pes planovalgus -I explained to patient the etiology of pes planovalgus and relationship with Planter fasciitis and various treatment options were discussed.  Given patient foot structure in the setting of Planter fasciitis I believe patient will benefit from custom-made orthotics to help control the hindfoot motion support the arch of the foot and take the stress away from plantar fascial.  Patient agrees with the plan like to proceed with orthotics - Patient is awaiting orthotics   Procedure: Injection Tendon/Ligament Location: Right plantar fascia at the glabrous junction; medial approach. Skin Prep: alcohol Injectate: 0.5 cc 0.5% marcaine  plain, 0.5 cc of 1% Lidocaine , 0.5 cc kenalog  10. Disposition: Patient tolerated procedure well. Injection site dressed with a band-aid.  No follow-ups on file.

## 2024-03-28 ENCOUNTER — Telehealth: Payer: Self-pay

## 2024-03-28 NOTE — Telephone Encounter (Signed)
 Orthotics here chargs pending as of 03/28/2024  $4000 DED/ $6,001.34 remains w/ 30% COINS after met

## 2024-03-28 NOTE — Telephone Encounter (Signed)
 No financial form on file

## 2024-03-29 NOTE — Telephone Encounter (Signed)
 Orthotics in [place] bin appt set for 10/09

## 2024-04-04 ENCOUNTER — Ambulatory Visit: Admitting: Podiatry

## 2024-04-04 DIAGNOSIS — M722 Plantar fascial fibromatosis: Secondary | ICD-10-CM

## 2024-04-04 DIAGNOSIS — M62461 Contracture of muscle, right lower leg: Secondary | ICD-10-CM

## 2024-04-04 NOTE — Progress Notes (Signed)
 Orthotics were dispensed are functioning well no acute complaints.  Break-in period was discussed.  Fitting well

## 2024-04-18 ENCOUNTER — Other Ambulatory Visit: Payer: Self-pay | Admitting: Nurse Practitioner

## 2024-04-18 ENCOUNTER — Ambulatory Visit
Admission: RE | Admit: 2024-04-18 | Discharge: 2024-04-18 | Disposition: A | Discharge status: Home / Self Care | Source: Ambulatory Visit | Attending: Nurse Practitioner | Admitting: Nurse Practitioner

## 2024-04-18 ENCOUNTER — Ambulatory Visit
Admission: RE | Admit: 2024-04-18 | Discharge: 2024-04-18 | Disposition: A | Discharge status: Home / Self Care | Attending: Nurse Practitioner | Admitting: Nurse Practitioner

## 2024-04-18 DIAGNOSIS — M25561 Pain in right knee: Secondary | ICD-10-CM | POA: Diagnosis present

## 2024-07-15 ENCOUNTER — Telehealth: Payer: Self-pay | Admitting: Podiatry

## 2024-07-15 NOTE — Telephone Encounter (Signed)
 Called patient she states she picked up inserts already.

## 2024-07-25 ENCOUNTER — Ambulatory Visit: Admitting: Family Medicine

## 2024-07-25 ENCOUNTER — Encounter: Payer: Self-pay | Admitting: Family Medicine

## 2024-07-25 ENCOUNTER — Ambulatory Visit: Payer: Self-pay | Admitting: Family Medicine

## 2024-07-25 VITALS — BP 109/65 | HR 63 | Ht 66.0 in | Wt 237.2 lb

## 2024-07-25 DIAGNOSIS — Z3480 Encounter for supervision of other normal pregnancy, unspecified trimester: Secondary | ICD-10-CM

## 2024-07-25 DIAGNOSIS — Z3201 Encounter for pregnancy test, result positive: Secondary | ICD-10-CM

## 2024-07-25 DIAGNOSIS — Z3046 Encounter for surveillance of implantable subdermal contraceptive: Secondary | ICD-10-CM

## 2024-07-25 DIAGNOSIS — Z98891 History of uterine scar from previous surgery: Secondary | ICD-10-CM

## 2024-07-25 DIAGNOSIS — Z3009 Encounter for other general counseling and advice on contraception: Secondary | ICD-10-CM

## 2024-07-25 DIAGNOSIS — Z3049 Encounter for surveillance of other contraceptives: Secondary | ICD-10-CM

## 2024-07-25 LAB — HEMOGLOBIN, FINGERSTICK: Hemoglobin: 11.6 g/dL (ref 11.1–15.9)

## 2024-07-25 LAB — PREGNANCY, URINE: Preg Test, Ur: POSITIVE — AB

## 2024-07-25 NOTE — Progress Notes (Signed)
 " [facility] [address], [address] [place] [address] Main phone: [phone]  Women's Health Problem Visit   Subjective:  Stephine Langbehn is a 34 y.o. being seen today for nexplanon  removal. Reports it was placed in 2020 and is expired. Pt states she had a home pregnancy test that was positive..   Chief Complaint  Patient presents with   Contraception    Nexplanon  removal     HPI  Desires to keep pregnancy- and remove nexplanon .   Health Maintenance Due  Topic Date Due   Hepatitis C Screening  Never done   DTaP/Tdap/Td (1 - Tdap) Never done   Hepatitis B Vaccines 19-59 Average Risk (1 of 3 - 19+ 3-dose series) Never done   COVID-19 Vaccine (3 - Pfizer risk series) 11/26/2019   Cervical Cancer Screening (HPV/Pap Cotest)  05/02/2022   Influenza Vaccine  01/26/2024    ROS  The following portions of the patient's history were reviewed and updated as appropriate: allergies, current medications, past family history, past medical history, past social history, past surgical history and problem list. Problem list updated.  See flowsheet for other program required questions.  Objective:   Vitals:   07/25/24 0826  BP: 109/65  Pulse: 63  Weight: 237 lb 3.2 oz (107.6 kg)  Height: 5' 6 (1.676 m)    Physical Exam Constitutional:      Appearance: She is obese.  HENT:     Head: Normocephalic and atraumatic.  Pulmonary:     Effort: Pulmonary effort is normal.  Abdominal:     Palpations: Abdomen is soft.  Musculoskeletal:        General: Normal range of motion.  Skin:    General: Skin is warm and dry.  Neurological:     General: No focal deficit present.     Mental Status: She is alert.  Psychiatric:        Mood and Affect: Mood normal.        Behavior: Behavior normal.     Assessment and Plan:  Ellysa Parrack is a 34 y.o. female presenting to the [facility] Department for a Women's  Health problem visit  1. Pregnancy test performed, pregnancy confirmed (Primary)  - Pregnancy, urine - TSH - Hemoglobin, fingerstick - Protein / creatinine ratio, urine - Pregnancy, Initial Screen - Comprehensive metabolic panel with GFR - Hgb J8r w/o eAG - Varicella zoster antibody, IgG  2. Encounter for Nexplanon  removal Procedure:  Nexplanon  Removal Patient identified, informed consent performed, consent signed.   Appropriate time out taken. Nexplanon  site identified in the patient's left arm.  Area prepped in usual sterile fashon. 1.5 ml of 1% lidocaine  with Epinephrine was used to anesthetize the area at the distal end of the implant and along implant site. A small stab incision was made right beside the implant on the distal portion.  The Nexplanon  rod was grasped manually and removed without difficulty.  There was minimal blood loss. There were no complications.  Steri-strips were applied over the small incision.  A pressure bandage was applied to reduce any bruising.  The patient tolerated the procedure well and was given post procedure instructions.   Nexplanon :   Counseled patient to take OTC analgesic starting as soon as lidocaine  starts to wear off and take regularly for at least 48 hr to decrease discomfort.  Specifically to take with food or milk to decrease stomach upset and counseled NOT to take ibuprofen  since she is  pregnant. Encouraged to take Tylenol  for pain control.      3. History of cesarean delivery -desires VBAC, counseled on delivery plans at 32 weeks  4. Supervision of other normal pregnancy, antepartum -ordered dating US  today -ordered some new OB labs, pt will have care with [facility], unable to order Maternity 21 due to early gestation -pt reports she was on phentermine- stopped taking in December after found out she was pregnant -reports she was also taking a supplement from [employer]- Vitamin D3 + K2 . The K2 was high dose- counseled to discontinue for now-  reviewed that most nutrients can be from diet, pt is also taking PNV and will continue with this  - US  OB LESS THAN 14 WEEKS WITH OB TRANSVAGINAL; Future   No follow-ups on file.  Future Appointments  Date Time Provider Department Center  08/01/2024  9:05 AM [facility] NURSE [facility] None    Verneta Bers, FNP "

## 2024-07-25 NOTE — Progress Notes (Signed)
 Pt is here Nexplanon  removal. UPT: positive. Nexplanon  removed  successfully from the left arm by Verneta Bers, FNP and pt tolerated well to the process with no complications. New OB appt. Set for pt to start antenatal care.  Opportunity given to pt to ask questions for any clarifications. Questions Answered. [name], RN.

## 2024-07-26 ENCOUNTER — Telehealth: Payer: Self-pay

## 2024-07-26 NOTE — Telephone Encounter (Signed)
 TC to client to inform her of [facility] U/S on 08/02/24 and to arrive at 10:45 am with a full bladder. Patient states understanding.Hulan Parish, RN

## 2024-07-27 LAB — PREGNANCY, INITIAL SCREEN
Antibody Screen: NEGATIVE
Basophils Absolute: 0 10*3/uL (ref 0.0–0.2)
Basos: 0 %
Bilirubin, UA: NEGATIVE
Chlamydia trachomatis, NAA: NEGATIVE
EOS (ABSOLUTE): 0.3 10*3/uL (ref 0.0–0.4)
Eos: 4 %
Glucose, UA: NEGATIVE
HCV Ab: NONREACTIVE
HIV Screen 4th Generation wRfx: NONREACTIVE
Hematocrit: 35.9 % (ref 34.0–46.6)
Hemoglobin: 11.6 g/dL (ref 11.1–15.9)
Hepatitis B Surface Ag: NEGATIVE
Immature Grans (Abs): 0 10*3/uL (ref 0.0–0.1)
Immature Granulocytes: 0 %
Ketones, UA: NEGATIVE
Leukocytes,UA: NEGATIVE
Lymphocytes Absolute: 1.7 10*3/uL (ref 0.7–3.1)
Lymphs: 25 %
MCH: 29.4 pg (ref 26.6–33.0)
MCHC: 32.3 g/dL (ref 31.5–35.7)
MCV: 91 fL (ref 79–97)
Monocytes Absolute: 0.2 10*3/uL (ref 0.1–0.9)
Monocytes: 3 %
Neisseria Gonorrhoeae by PCR: NEGATIVE
Neutrophils Absolute: 4.6 10*3/uL (ref 1.4–7.0)
Neutrophils: 68 %
Nitrite, UA: NEGATIVE
Platelets: 194 10*3/uL (ref 150–450)
Protein,UA: NEGATIVE
RBC, UA: NEGATIVE
RBC: 3.95 x10E6/uL (ref 3.77–5.28)
RDW: 12.9 % (ref 11.7–15.4)
RPR Ser Ql: NONREACTIVE
Rh Factor: POSITIVE
Rubella Antibodies, IGG: <0.9 {index} — ABNORMAL LOW
Specific Gravity, UA: 1.016 (ref 1.005–1.030)
Urobilinogen, Ur: 0.2 mg/dL (ref 0.2–1.0)
WBC: 6.9 10*3/uL (ref 3.4–10.8)
pH, UA: 8 — ABNORMAL HIGH (ref 5.0–7.5)

## 2024-07-27 LAB — HGB A1C W/O EAG: Hgb A1c MFr Bld: 5.2 % (ref 4.8–5.6)

## 2024-07-27 LAB — COMPREHENSIVE METABOLIC PANEL WITH GFR
ALT: 16 [IU]/L (ref 0–32)
AST: 15 [IU]/L (ref 0–40)
Albumin: 4.3 g/dL (ref 3.9–4.9)
Alkaline Phosphatase: 62 [IU]/L (ref 41–116)
BUN/Creatinine Ratio: 18 (ref 9–23)
BUN: 11 mg/dL (ref 6–20)
Bilirubin Total: 0.4 mg/dL (ref 0.0–1.2)
CO2: 21 mmol/L (ref 20–29)
Calcium: 8.7 mg/dL (ref 8.7–10.2)
Chloride: 104 mmol/L (ref 96–106)
Creatinine, Ser: 0.61 mg/dL (ref 0.57–1.00)
Globulin, Total: 2.2 g/dL (ref 1.5–4.5)
Glucose: 86 mg/dL (ref 70–99)
Potassium: 3.9 mmol/L (ref 3.5–5.2)
Sodium: 137 mmol/L (ref 134–144)
Total Protein: 6.5 g/dL (ref 6.0–8.5)
eGFR: 122 mL/min/{1.73_m2}

## 2024-07-27 LAB — MICROSCOPIC EXAMINATION
Bacteria, UA: NONE SEEN
Casts: NONE SEEN /LPF
RBC, Urine: NONE SEEN /HPF (ref 0–2)
WBC, UA: NONE SEEN /HPF (ref 0–5)

## 2024-07-27 LAB — PROTEIN / CREATININE RATIO, URINE
Creatinine, Urine: 67.9 mg/dL
Protein, Ur: 4.7 mg/dL
Protein/Creat Ratio: 69 mg/g{creat} (ref 0–200)

## 2024-07-27 LAB — HCV INTERPRETATION

## 2024-07-27 LAB — VARICELLA ZOSTER ANTIBODY, IGG: Varicella zoster IgG: REACTIVE

## 2024-07-27 LAB — URINE CULTURE, OB REFLEX

## 2024-07-27 LAB — TSH: TSH: 1.01 u[IU]/mL (ref 0.450–4.500)

## 2024-08-01 ENCOUNTER — Encounter

## 2024-08-01 ENCOUNTER — Encounter: Payer: Self-pay | Admitting: Family Medicine

## 2024-08-01 DIAGNOSIS — Z2839 Other underimmunization status: Secondary | ICD-10-CM | POA: Insufficient documentation

## 2024-08-02 ENCOUNTER — Ambulatory Visit: Admission: RE | Admit: 2024-08-02

## 2024-08-02 DIAGNOSIS — Z3480 Encounter for supervision of other normal pregnancy, unspecified trimester: Secondary | ICD-10-CM

## 2024-08-22 ENCOUNTER — Encounter
